# Patient Record
Sex: Female | Born: 1995 | Race: White | Hispanic: No | Marital: Married | Smoking: Never smoker
Health system: Southern US, Community
[De-identification: ages and names within clinical notes are randomized; demographics above are authoritative.]

## PROBLEM LIST (undated history)

## (undated) DIAGNOSIS — F419 Anxiety disorder, unspecified: Secondary | ICD-10-CM

## (undated) DIAGNOSIS — F32A Depression, unspecified: Secondary | ICD-10-CM

## (undated) DIAGNOSIS — G43909 Migraine, unspecified, not intractable, without status migrainosus: Secondary | ICD-10-CM

## (undated) HISTORY — DX: Anxiety disorder, unspecified: F41.9

## (undated) HISTORY — DX: Depression, unspecified: F32.A

## (undated) HISTORY — DX: Migraine, unspecified, not intractable, without status migrainosus: G43.909

---

## 2021-03-07 ENCOUNTER — Inpatient Hospital Stay (HOSPITAL_COMMUNITY): Admission: AD | Admit: 2021-03-07 | Payer: Self-pay | Source: Home / Self Care | Admitting: Obstetrics and Gynecology

## 2021-03-21 LAB — OB RESULTS CONSOLE RPR
RPR: NONREACTIVE
RPR: NONREACTIVE

## 2021-04-30 LAB — OB RESULTS CONSOLE GBS: GBS: NEGATIVE

## 2021-05-23 ENCOUNTER — Encounter (HOSPITAL_COMMUNITY): Payer: Self-pay | Admitting: *Deleted

## 2021-05-23 ENCOUNTER — Telehealth (HOSPITAL_COMMUNITY): Payer: Self-pay | Admitting: *Deleted

## 2021-05-23 NOTE — Telephone Encounter (Signed)
Preadmission screen  

## 2021-05-27 ENCOUNTER — Telehealth (HOSPITAL_COMMUNITY): Payer: Self-pay | Admitting: *Deleted

## 2021-05-27 NOTE — Telephone Encounter (Signed)
Preadmission screen  

## 2021-05-28 ENCOUNTER — Telehealth (HOSPITAL_COMMUNITY): Payer: Self-pay | Admitting: *Deleted

## 2021-05-28 ENCOUNTER — Other Ambulatory Visit: Payer: Self-pay | Admitting: Obstetrics and Gynecology

## 2021-05-28 NOTE — Telephone Encounter (Signed)
Preadmission screen  

## 2021-05-29 ENCOUNTER — Other Ambulatory Visit: Payer: Self-pay | Admitting: Obstetrics and Gynecology

## 2021-05-29 LAB — SARS CORONAVIRUS 2 (TAT 6-24 HRS): SARS Coronavirus 2: NEGATIVE

## 2021-05-30 ENCOUNTER — Inpatient Hospital Stay (HOSPITAL_COMMUNITY): Payer: BC Managed Care – PPO

## 2021-05-31 ENCOUNTER — Inpatient Hospital Stay (HOSPITAL_COMMUNITY): Payer: BC Managed Care – PPO

## 2021-05-31 ENCOUNTER — Inpatient Hospital Stay (HOSPITAL_COMMUNITY): Payer: BC Managed Care – PPO | Admitting: Anesthesiology

## 2021-05-31 ENCOUNTER — Other Ambulatory Visit: Payer: Self-pay

## 2021-05-31 ENCOUNTER — Inpatient Hospital Stay (HOSPITAL_COMMUNITY)
Admission: AD | Admit: 2021-05-31 | Discharge: 2021-06-03 | DRG: 787 | Disposition: A | Discharge status: Home / Self Care | Payer: BC Managed Care – PPO | Attending: Obstetrics and Gynecology | Admitting: Obstetrics and Gynecology

## 2021-05-31 ENCOUNTER — Encounter (HOSPITAL_COMMUNITY): Payer: Self-pay | Admitting: Obstetrics and Gynecology

## 2021-05-31 DIAGNOSIS — R609 Edema, unspecified: Secondary | ICD-10-CM | POA: Diagnosis not present

## 2021-05-31 DIAGNOSIS — D62 Acute posthemorrhagic anemia: Secondary | ICD-10-CM | POA: Diagnosis not present

## 2021-05-31 DIAGNOSIS — O9081 Anemia of the puerperium: Secondary | ICD-10-CM | POA: Diagnosis not present

## 2021-05-31 DIAGNOSIS — O1204 Gestational edema, complicating childbirth: Secondary | ICD-10-CM | POA: Diagnosis present

## 2021-05-31 DIAGNOSIS — O99340 Other mental disorders complicating pregnancy, unspecified trimester: Secondary | ICD-10-CM | POA: Diagnosis present

## 2021-05-31 DIAGNOSIS — O99344 Other mental disorders complicating childbirth: Secondary | ICD-10-CM | POA: Diagnosis present

## 2021-05-31 DIAGNOSIS — F419 Anxiety disorder, unspecified: Secondary | ICD-10-CM | POA: Diagnosis present

## 2021-05-31 DIAGNOSIS — Z23 Encounter for immunization: Secondary | ICD-10-CM

## 2021-05-31 DIAGNOSIS — O9902 Anemia complicating childbirth: Secondary | ICD-10-CM | POA: Diagnosis present

## 2021-05-31 DIAGNOSIS — Z3A39 39 weeks gestation of pregnancy: Secondary | ICD-10-CM | POA: Diagnosis not present

## 2021-05-31 DIAGNOSIS — Z3490 Encounter for supervision of normal pregnancy, unspecified, unspecified trimester: Secondary | ICD-10-CM | POA: Diagnosis present

## 2021-05-31 DIAGNOSIS — O99214 Obesity complicating childbirth: Secondary | ICD-10-CM | POA: Diagnosis present

## 2021-05-31 DIAGNOSIS — O324XX0 Maternal care for high head at term, not applicable or unspecified: Secondary | ICD-10-CM | POA: Diagnosis present

## 2021-05-31 DIAGNOSIS — F319 Bipolar disorder, unspecified: Secondary | ICD-10-CM | POA: Diagnosis present

## 2021-05-31 LAB — CBC
HCT: 30.8 % — ABNORMAL LOW (ref 36.0–46.0)
Hemoglobin: 9.9 g/dL — ABNORMAL LOW (ref 12.0–15.0)
MCH: 25.6 pg — ABNORMAL LOW (ref 26.0–34.0)
MCHC: 32.1 g/dL (ref 30.0–36.0)
MCV: 79.6 fL — ABNORMAL LOW (ref 80.0–100.0)
Platelets: 215 10*3/uL (ref 150–400)
RBC: 3.87 MIL/uL (ref 3.87–5.11)
RDW: 12.6 % (ref 11.5–15.5)
WBC: 6.1 10*3/uL (ref 4.0–10.5)
nRBC: 0 % (ref 0.0–0.2)

## 2021-05-31 LAB — RPR: RPR Ser Ql: NONREACTIVE

## 2021-05-31 LAB — TYPE AND SCREEN
ABO/RH(D): O POS
Antibody Screen: NEGATIVE

## 2021-05-31 MED ORDER — FENTANYL-BUPIVACAINE-NACL 0.5-0.125-0.9 MG/250ML-% EP SOLN
12.0000 mL/h | EPIDURAL | Status: DC | PRN
  Administered 2021-05-31: 12 mL/h via EPIDURAL
  Filled 2021-05-31: qty 250

## 2021-05-31 MED ORDER — PHENYLEPHRINE 40 MCG/ML (10ML) SYRINGE FOR IV PUSH (FOR BLOOD PRESSURE SUPPORT): 80.0000 ug | PREFILLED_SYRINGE | INTRAVENOUS | Status: DC | PRN

## 2021-05-31 MED ORDER — TERBUTALINE SULFATE 1 MG/ML IJ SOLN: 0.2500 mg | Freq: Once | INTRAMUSCULAR | Status: DC | PRN

## 2021-05-31 MED ORDER — DIPHENHYDRAMINE HCL 50 MG/ML IJ SOLN
12.5000 mg | INTRAMUSCULAR | Status: DC | PRN
Start: 2021-05-31 — End: 2021-06-01
  Administered 2021-05-31: 12.5 mg via INTRAVENOUS
  Filled 2021-05-31: qty 1

## 2021-05-31 MED ORDER — SOD CITRATE-CITRIC ACID 500-334 MG/5ML PO SOLN
30.0000 mL | ORAL | Status: DC | PRN
  Administered 2021-06-01: 30 mL via ORAL
  Filled 2021-05-31: qty 30

## 2021-05-31 MED ORDER — EPHEDRINE 5 MG/ML INJ: 10.0000 mg | INTRAVENOUS | Status: DC | PRN

## 2021-05-31 MED ORDER — LIDOCAINE HCL (PF) 1 % IJ SOLN
INTRAMUSCULAR | Status: DC | PRN
  Administered 2021-05-31 (×2): 4 mL via EPIDURAL

## 2021-05-31 MED ORDER — MISOPROSTOL 25 MCG QUARTER TABLET
25.0000 ug | ORAL_TABLET | ORAL | Status: DC | PRN
  Administered 2021-05-31 (×2): 25 ug via VAGINAL
  Filled 2021-05-31 (×3): qty 1

## 2021-05-31 MED ORDER — OXYTOCIN-SODIUM CHLORIDE 30-0.9 UT/500ML-% IV SOLN
1.0000 m[IU]/min | INTRAVENOUS | Status: DC
  Administered 2021-05-31: 4 m[IU]/min via INTRAVENOUS
  Administered 2021-05-31: 2 m[IU]/min via INTRAVENOUS
  Administered 2021-06-01: 18 m[IU]/min via INTRAVENOUS

## 2021-05-31 MED ORDER — ACETAMINOPHEN 325 MG PO TABS: 650.0000 mg | ORAL_TABLET | ORAL | Status: DC | PRN

## 2021-05-31 MED ORDER — LIDOCAINE HCL (PF) 1 % IJ SOLN: 30.0000 mL | INTRAMUSCULAR | Status: DC | PRN

## 2021-05-31 MED ORDER — OXYTOCIN BOLUS FROM INFUSION
333.0000 mL | Freq: Once | INTRAVENOUS | Status: DC
Start: 2021-05-31 — End: 2021-06-01

## 2021-05-31 MED ORDER — LACTATED RINGERS IV SOLN: INTRAVENOUS | Status: DC

## 2021-05-31 MED ORDER — LACTATED RINGERS IV SOLN
500.0000 mL | Freq: Once | INTRAVENOUS | Status: AC
  Administered 2021-05-31: 500 mL via INTRAVENOUS

## 2021-05-31 MED ORDER — OXYTOCIN-SODIUM CHLORIDE 30-0.9 UT/500ML-% IV SOLN
2.5000 [IU]/h | INTRAVENOUS | Status: DC
  Filled 2021-05-31: qty 500

## 2021-05-31 MED ORDER — ONDANSETRON HCL 4 MG/2ML IJ SOLN
4.0000 mg | Freq: Four times a day (QID) | INTRAMUSCULAR | Status: DC | PRN
  Administered 2021-05-31: 4 mg via INTRAVENOUS
  Filled 2021-05-31: qty 2

## 2021-05-31 MED ORDER — LACTATED RINGERS IV SOLN: 500.0000 mL | INTRAVENOUS | Status: DC | PRN

## 2021-05-31 NOTE — Progress Notes (Signed)
Lindsey Lee is a 25 y.o. G1P0 at [redacted]w[redacted]d by LMP admitted for induction of labor due to worsening anxiety on meds.  Subjective: Feels contractions getting more intense  Objective: BP (!) 101/54 (BP Location: Left Arm)   Pulse 90   Temp 98.8 F (37.1 C) (Oral)   Resp 17   Ht 5\' 2"  (1.575 m)   Wt 83.7 kg   BMI 33.76 kg/m  No intake/output data recorded. No intake/output data recorded.  FHT:  FHR: 143 bpm, variability: moderate,  accelerations:  Present,  decelerations:  Absent UC:   irregular, every 3 minutes SVE:   2-3/7-/-1 AROM- clear  Labs: Lab Results  Component Value Date   WBC 6.1 05/31/2021   HGB 9.9 (L) 05/31/2021   HCT 30.8 (L) 05/31/2021   MCV 79.6 (L) 05/31/2021   PLT 215 05/31/2021    Assessment / Plan: IOL due to medical issues  Labor: Progressing normally Preeclampsia:  no signs or symptoms of toxicity Fetal Wellbeing:  Category I Pain Control:  Labor support without medications I/D:  n/a Anticipated MOD:  NSVD  Kenichi Cassada J 05/31/2021, 8:20 AM

## 2021-05-31 NOTE — H&P (Addendum)
Lindsey Lee is a 25 y.o. 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 OB History     Gravida  1   Para      Term      Preterm      AB      Living         SAB      IAB      Ectopic      Multiple      Live Births             History reviewed. No pertinent past medical history. History reviewed. No pertinent surgical history. Family History: family history is not on file. Social History:  reports that she has never smoked. She has never used smokeless tobacco. She reports that she does not drink alcohol and does not use drugs.     Maternal Diabetes: No Genetic Screening: Normal Maternal Ultrasounds/Referrals: Normal Fetal Ultrasounds or other Referrals:  Fetal echo Maternal Substance Abuse:  No Significant Maternal Medications:  Meds include: Zoloft Significant Maternal Lab Results:  Group B Strep negative Other Comments:  None  Review of Systems  Constitutional: Negative.   All other systems reviewed and are negative. Maternal Medical History:  Contractions: Perceived severity is mild.   Fetal activity: Perceived fetal activity is normal.   Last perceived fetal movement was within the past hour.   Prenatal complications: no prenatal complications Prenatal Complications - Diabetes: none.  Dilation: 1 Effacement (%): 50 Station: -3 Exam by:: Boneta Lucks Adcox, RN Temperature 98.3 F (36.8 C), temperature source Oral, resp. rate 18, height 5\' 2"  (1.575 m), weight 83.7 kg. Maternal Exam:  Uterine Assessment: Contraction strength is mild.  Contraction frequency is rare.  Abdomen: Patient reports no abdominal tenderness. Fetal presentation: vertex Introitus: Normal vulva. Normal vagina.  Ferning test: not done.  Nitrazine test: not done. Amniotic fluid character: not assessed. Pelvis: questionable for delivery.    Physical Exam Constitutional:      Appearance: Normal appearance. She is normal  weight.  HENT:     Head: Normocephalic and atraumatic.  Cardiovascular:     Rate and Rhythm: Normal rate and regular rhythm.     Pulses: Normal pulses.     Heart sounds: Normal heart sounds.  Pulmonary:     Effort: Pulmonary effort is normal.     Breath sounds: Normal breath sounds.  Genitourinary:    General: Normal vulva.  Musculoskeletal:        General: Normal range of motion.     Cervical back: Normal range of motion and neck supple.  Skin:    General: Skin is warm and dry.  Neurological:     General: No focal deficit present.     Mental Status: She is alert and oriented to person, place, and time.  Psychiatric:        Mood and Affect: Mood normal.        Behavior: Behavior normal.    Prenatal labs: ABO, Rh: --/--/O POS (09/23 0048) Antibody: NEG (09/23 0048) Rubella:  imm RPR: Nonreactive, Nonreactive (07/14 0000)  HBsAg:   imm HIV:   neg GBS: Negative/-- (08/23 0000)   Assessment/Plan: 39+ week IUP IOL for increasing anxiety   Lindsey Lee J 05/31/2021, 6:23 AM

## 2021-05-31 NOTE — Anesthesia Procedure Notes (Signed)
Epidural Patient location during procedure: OB Start time: 05/31/2021 11:09 AM End time: 05/31/2021 11:17 AM  Staffing Anesthesiologist: Mal Amabile, MD Performed: anesthesiologist   Preanesthetic Checklist Completed: patient identified, IV checked, site marked, risks and benefits discussed, surgical consent, monitors and equipment checked, pre-op evaluation and timeout performed  Epidural Patient position: sitting Prep: DuraPrep and site prepped and draped Patient monitoring: continuous pulse ox and blood pressure Approach: midline Location: L4-L5 Injection technique: LOR air  Needle:  Needle type: Tuohy  Needle gauge: 17 G Needle length: 9 cm and 9 Needle insertion depth: 5 cm Catheter type: closed end flexible Catheter size: 19 Gauge Catheter at skin depth: 10 cm Test dose: negative and Other  Assessment Events: blood not aspirated, injection not painful, no injection resistance, no paresthesia and negative IV test  Additional Notes Patient identified. Risks and benefits discussed including failed block, incomplete  Pain control, post dural puncture headache, nerve damage, paralysis, blood pressure Changes, nausea, vomiting, reactions to medications-both toxic and allergic and post Partum back pain. All questions were answered. Patient expressed understanding and wished to proceed. Sterile technique was used throughout procedure. Epidural site was Dressed with sterile barrier dressing. No paresthesias, signs of intravascular injection Or signs of intrathecal spread were encountered.  Patient was more comfortable after the epidural was dosed. Please see RN's note for documentation of vital signs and FHR which are stable. Reason for block:procedure for pain

## 2021-05-31 NOTE — Progress Notes (Signed)
Lindsey Lee is a 25 y.o. G1P0 at [redacted]w[redacted]d by LMP admitted for induction of labor due to worsening anxiety on meds.  Subjective: Comfortable with epidural  Objective: BP 101/66   Pulse 85   Temp 99.5 F (37.5 C) (Axillary)   Resp 18   Ht 5\' 2"  (1.575 m)   Wt 83.7 kg   SpO2 99%   BMI 33.76 kg/m  No intake/output data recorded. No intake/output data recorded.  FHT:  FHR: 143 bpm, variability: moderate,  accelerations:  Present,  decelerations:  Absent UC:   irregular, every 3 minutes SVE:   6-7/100/0  Labs: Lab Results  Component Value Date   WBC 6.1 05/31/2021   HGB 9.9 (L) 05/31/2021   HCT 30.8 (L) 05/31/2021   MCV 79.6 (L) 05/31/2021   PLT 215 05/31/2021    Assessment / Plan: IOL due to medical issues  Labor: Progressing normally Preeclampsia:  no signs or symptoms of toxicity Fetal Wellbeing:  Category I Pain Control:  Labor support without medications I/D:  n/a Anticipated MOD:  NSVD  Hannan Tetzlaff J 05/31/2021, 6:44 PM

## 2021-05-31 NOTE — Anesthesia Preprocedure Evaluation (Addendum)
Anesthesia Evaluation  Patient identified by MRN, date of birth, ID band Patient awake    Reviewed: Allergy & Precautions, Patient's Chart, lab work & pertinent test results  Airway Mallampati: II  TM Distance: >3 FB Neck ROM: Full    Dental  (+) Teeth Intact, Dental Advisory Given   Pulmonary neg pulmonary ROS,    Pulmonary exam normal breath sounds clear to auscultation       Cardiovascular negative cardio ROS Normal cardiovascular exam Rhythm:Regular Rate:Normal     Neuro/Psych Bipolar Disorder negative neurological ROS     GI/Hepatic Neg liver ROS, GERD  ,  Endo/Other  Obesity  Renal/GU negative Renal ROS  negative genitourinary   Musculoskeletal Scoliosis- thoracolumbar   Abdominal (+) + obese,   Peds  Hematology  (+) anemia ,   Anesthesia Other Findings Thoracolumbar dextroscoliosis  Reproductive/Obstetrics (+) Pregnancy                            Anesthesia Physical Anesthesia Plan  ASA: 2  Anesthesia Plan: Epidural   Post-op Pain Management:    Induction:   PONV Risk Score and Plan:   Airway Management Planned: Natural Airway  Additional Equipment:   Intra-op Plan:   Post-operative Plan:   Informed Consent: I have reviewed the patients History and Physical, chart, labs and discussed the procedure including the risks, benefits and alternatives for the proposed anesthesia with the patient or authorized representative who has indicated his/her understanding and acceptance.       Plan Discussed with: Anesthesiologist  Anesthesia Plan Comments: (UPDATE 06/01/21 12:41 AM: Patient to go to OR for C section due to failure of descent. Labor epidural in place and functioning well. Will plan to use epidural for surgical anesthesia. Discussed with patient. )       Anesthesia Quick Evaluation

## 2021-05-31 NOTE — Progress Notes (Signed)
Late entry note:                                                                                                                                                                                                                                                                                                                                                                                                                                                                                                                                                                                     Lindsey Lee is a 25 y.o. G1P0 at [redacted]w[redacted]d by ultrasound admitted for induction of labor. Discussed r/b of IUPC placement and patient consents.   Subjective: S/p epidural, reports some pain on  the left side of her abdomen.   Objective: Vitals:   05/31/21 1501 05/31/21 1531 05/31/21 1601 05/31/21 1631  BP: 103/60 110/68 (!) 97/57 105/61  Pulse: 85 86 86 83  Resp:    18  Temp:      TempSrc:      Weight:      Height:        No intake/output data recorded. No intake/output data recorded. Pitocin infusing at 4 mu   FHT:  FHR: 120-15 bpm, variability: moderate,  accelerations:  Present,  decelerations:  Present occasional early and variable UC:   regular, every 2-4 minutes SVE:   Dilation: 3.5 Effacement (%): 80 Station: -1 Exam by:: m Lorra Freeman cnm IUPC placed without difficulty Bloody show present   Labs:   Recent Labs    05/31/21 0319  WBC 6.1  HGB 9.9*  HCT 30.8*  PLT 215    Assessment / Plan: G1P0 25 y.o. [redacted]w[redacted]d Induction of labor due to increasing anxiety, hx of bipolar on meds,  progressing well on pitocin  Labor: Continue to increase Pitocin to achieve >200 MVUs Recommend left lateral position with peanut  Fetal Wellbeing:  Category II with progression to cat 1 Pain Control:  Epidural, encouraged epidural PCA after position changes  Anticipated MOD:  NSVD  Dr. Billy Coast updated with patient  status   Karena Addison, CNM, MSN 05/31/2021, 5:09 PM

## 2021-06-01 ENCOUNTER — Encounter (HOSPITAL_COMMUNITY): Payer: Self-pay | Admitting: Obstetrics and Gynecology

## 2021-06-01 ENCOUNTER — Encounter (HOSPITAL_COMMUNITY): Payer: Self-pay

## 2021-06-01 ENCOUNTER — Encounter (HOSPITAL_COMMUNITY)
Admission: AD | Disposition: A | Discharge status: Home / Self Care | Payer: Self-pay | Source: Home / Self Care | Attending: Obstetrics and Gynecology

## 2021-06-01 DIAGNOSIS — F319 Bipolar disorder, unspecified: Secondary | ICD-10-CM | POA: Diagnosis present

## 2021-06-01 DIAGNOSIS — O99340 Other mental disorders complicating pregnancy, unspecified trimester: Secondary | ICD-10-CM | POA: Diagnosis present

## 2021-06-01 DIAGNOSIS — O9902 Anemia complicating childbirth: Secondary | ICD-10-CM | POA: Diagnosis present

## 2021-06-01 LAB — CBC
HCT: 24.8 % — ABNORMAL LOW (ref 36.0–46.0)
Hemoglobin: 8.1 g/dL — ABNORMAL LOW (ref 12.0–15.0)
MCH: 25.9 pg — ABNORMAL LOW (ref 26.0–34.0)
MCHC: 32.7 g/dL (ref 30.0–36.0)
MCV: 79.2 fL — ABNORMAL LOW (ref 80.0–100.0)
Platelets: 189 10*3/uL (ref 150–400)
RBC: 3.13 MIL/uL — ABNORMAL LOW (ref 3.87–5.11)
RDW: 12.7 % (ref 11.5–15.5)
WBC: 15.7 10*3/uL — ABNORMAL HIGH (ref 4.0–10.5)
nRBC: 0 % (ref 0.0–0.2)

## 2021-06-01 SURGERY — Surgical Case
Anesthesia: Epidural | Wound class: Clean Contaminated

## 2021-06-01 MED ORDER — BUPIVACAINE HCL (PF) 0.25 % IJ SOLN
INTRAMUSCULAR | Status: DC | PRN
  Administered 2021-06-01: 30 mL

## 2021-06-01 MED ORDER — NALBUPHINE HCL 10 MG/ML IJ SOLN
5.0000 mg | INTRAMUSCULAR | Status: DC | PRN
  Administered 2021-06-01 (×2): 5 mg via INTRAVENOUS
  Filled 2021-06-01 (×2): qty 1

## 2021-06-01 MED ORDER — TRANEXAMIC ACID-NACL 1000-0.7 MG/100ML-% IV SOLN
INTRAVENOUS | Status: AC
  Filled 2021-06-01: qty 100

## 2021-06-01 MED ORDER — SIMETHICONE 80 MG PO CHEW: 80.0000 mg | CHEWABLE_TABLET | ORAL | Status: DC | PRN

## 2021-06-01 MED ORDER — OXYTOCIN-SODIUM CHLORIDE 30-0.9 UT/500ML-% IV SOLN
2.5000 [IU]/h | INTRAVENOUS | Status: AC
  Administered 2021-06-01: 2.5 [IU]/h via INTRAVENOUS

## 2021-06-01 MED ORDER — PRENATAL MULTIVITAMIN CH
1.0000 | ORAL_TABLET | Freq: Every day | ORAL | Status: DC
  Administered 2021-06-01 – 2021-06-03 (×3): 1 via ORAL
  Filled 2021-06-01 (×3): qty 1

## 2021-06-01 MED ORDER — SERTRALINE HCL 50 MG PO TABS
50.0000 mg | ORAL_TABLET | Freq: Every day | ORAL | Status: DC
  Administered 2021-06-01: 50 mg via ORAL
  Filled 2021-06-01 (×2): qty 1

## 2021-06-01 MED ORDER — POLYSACCHARIDE IRON COMPLEX 150 MG PO CAPS
150.0000 mg | ORAL_CAPSULE | Freq: Every day | ORAL | Status: DC
  Administered 2021-06-02 – 2021-06-03 (×2): 150 mg via ORAL
  Filled 2021-06-01 (×2): qty 1

## 2021-06-01 MED ORDER — MENTHOL 3 MG MT LOZG: 1.0000 | LOZENGE | OROMUCOSAL | Status: DC | PRN

## 2021-06-01 MED ORDER — STERILE WATER FOR IRRIGATION IR SOLN
Status: DC | PRN
  Administered 2021-06-01: 1

## 2021-06-01 MED ORDER — LAMOTRIGINE 100 MG PO TABS
100.0000 mg | ORAL_TABLET | Freq: Every day | ORAL | Status: DC
  Administered 2021-06-01: 100 mg via ORAL
  Filled 2021-06-01 (×3): qty 1

## 2021-06-01 MED ORDER — MIRTAZAPINE 7.5 MG PO TABS
7.5000 mg | ORAL_TABLET | Freq: Every day | ORAL | Status: DC
  Administered 2021-06-01 – 2021-06-02 (×2): 7.5 mg via ORAL
  Filled 2021-06-01 (×3): qty 1

## 2021-06-01 MED ORDER — KETOROLAC TROMETHAMINE 30 MG/ML IJ SOLN
30.0000 mg | Freq: Four times a day (QID) | INTRAMUSCULAR | Status: AC
  Administered 2021-06-01 (×4): 30 mg via INTRAVENOUS
  Filled 2021-06-01 (×4): qty 1

## 2021-06-01 MED ORDER — SODIUM CHLORIDE 0.9 % IR SOLN
Status: DC | PRN
  Administered 2021-06-01: 1

## 2021-06-01 MED ORDER — COCONUT OIL OIL: 1.0000 "application " | TOPICAL_OIL | Status: DC | PRN

## 2021-06-01 MED ORDER — FENTANYL CITRATE (PF) 100 MCG/2ML IJ SOLN
INTRAMUSCULAR | Status: AC
  Filled 2021-06-01: qty 2

## 2021-06-01 MED ORDER — SODIUM CHLORIDE 0.9 % IV SOLN
500.0000 mg | INTRAVENOUS | Status: DC
  Administered 2021-06-01: 500 mg via INTRAVENOUS
  Filled 2021-06-01: qty 25

## 2021-06-01 MED ORDER — HYDROXYZINE HCL 25 MG PO TABS: 50.0000 mg | ORAL_TABLET | Freq: Three times a day (TID) | ORAL | Status: DC | PRN

## 2021-06-01 MED ORDER — MORPHINE SULFATE (PF) 0.5 MG/ML IJ SOLN
INTRAMUSCULAR | Status: DC | PRN
  Administered 2021-06-01: 3 mg via EPIDURAL

## 2021-06-01 MED ORDER — ZOLPIDEM TARTRATE 5 MG PO TABS: 5.0000 mg | ORAL_TABLET | Freq: Every evening | ORAL | Status: DC | PRN

## 2021-06-01 MED ORDER — SENNOSIDES-DOCUSATE SODIUM 8.6-50 MG PO TABS
2.0000 | ORAL_TABLET | Freq: Every day | ORAL | Status: DC
  Administered 2021-06-02 (×2): 2 via ORAL
  Filled 2021-06-01 (×2): qty 2

## 2021-06-01 MED ORDER — MORPHINE SULFATE (PF) 0.5 MG/ML IJ SOLN
INTRAMUSCULAR | Status: AC
  Filled 2021-06-01: qty 10

## 2021-06-01 MED ORDER — ONDANSETRON HCL 4 MG/2ML IJ SOLN
INTRAMUSCULAR | Status: DC | PRN
  Administered 2021-06-01: 4 mg via INTRAVENOUS

## 2021-06-01 MED ORDER — SIMETHICONE 80 MG PO CHEW
80.0000 mg | CHEWABLE_TABLET | Freq: Three times a day (TID) | ORAL | Status: DC
  Administered 2021-06-01 – 2021-06-03 (×7): 80 mg via ORAL
  Filled 2021-06-01 (×8): qty 1

## 2021-06-01 MED ORDER — WITCH HAZEL-GLYCERIN EX PADS: 1.0000 "application " | MEDICATED_PAD | CUTANEOUS | Status: DC | PRN

## 2021-06-01 MED ORDER — DIBUCAINE (PERIANAL) 1 % EX OINT: 1.0000 "application " | TOPICAL_OINTMENT | CUTANEOUS | Status: DC | PRN

## 2021-06-01 MED ORDER — OXYTOCIN-SODIUM CHLORIDE 30-0.9 UT/500ML-% IV SOLN
INTRAVENOUS | Status: AC
  Filled 2021-06-01: qty 500

## 2021-06-01 MED ORDER — LIDOCAINE-EPINEPHRINE (PF) 2 %-1:200000 IJ SOLN
INTRAMUSCULAR | Status: DC | PRN
  Administered 2021-06-01 (×3): 5 mL via EPIDURAL
  Administered 2021-06-01: 3 mL via EPIDURAL
  Administered 2021-06-01: 2 mL via EPIDURAL

## 2021-06-01 MED ORDER — TETANUS-DIPHTH-ACELL PERTUSSIS 5-2.5-18.5 LF-MCG/0.5 IM SUSY
0.5000 mL | PREFILLED_SYRINGE | Freq: Once | INTRAMUSCULAR | Status: DC
  Filled 2021-06-01: qty 0.5

## 2021-06-01 MED ORDER — OXYCODONE-ACETAMINOPHEN 5-325 MG PO TABS: 1.0000 | ORAL_TABLET | ORAL | Status: DC | PRN

## 2021-06-01 MED ORDER — FENTANYL CITRATE (PF) 100 MCG/2ML IJ SOLN
INTRAMUSCULAR | Status: DC | PRN
  Administered 2021-06-01: 100 ug via EPIDURAL

## 2021-06-01 MED ORDER — LACTATED RINGERS IV SOLN: INTRAVENOUS | Status: DC | PRN

## 2021-06-01 MED ORDER — METHYLERGONOVINE MALEATE 0.2 MG/ML IJ SOLN: 0.2000 mg | INTRAMUSCULAR | Status: DC | PRN

## 2021-06-01 MED ORDER — METHYLERGONOVINE MALEATE 0.2 MG PO TABS
0.2000 mg | ORAL_TABLET | ORAL | Status: DC | PRN
Start: 2021-06-01 — End: 2021-06-03

## 2021-06-01 MED ORDER — ONDANSETRON HCL 4 MG/2ML IJ SOLN
INTRAMUSCULAR | Status: AC
  Filled 2021-06-01: qty 2

## 2021-06-01 MED ORDER — MAGNESIUM OXIDE -MG SUPPLEMENT 400 (240 MG) MG PO TABS
400.0000 mg | ORAL_TABLET | Freq: Every day | ORAL | Status: DC
  Administered 2021-06-03: 400 mg via ORAL
  Filled 2021-06-01 (×2): qty 1

## 2021-06-01 MED ORDER — INFLUENZA VAC SPLIT QUAD 0.5 ML IM SUSY
0.5000 mL | PREFILLED_SYRINGE | INTRAMUSCULAR | Status: AC | PRN
  Administered 2021-06-03: 0.5 mL via INTRAMUSCULAR
  Filled 2021-06-01: qty 0.5

## 2021-06-01 MED ORDER — SCOPOLAMINE 1 MG/3DAYS TD PT72
1.0000 | MEDICATED_PATCH | TRANSDERMAL | Status: DC
  Administered 2021-06-01: 1.5 mg via TRANSDERMAL
  Filled 2021-06-01: qty 1

## 2021-06-01 MED ORDER — OXYTOCIN-SODIUM CHLORIDE 30-0.9 UT/500ML-% IV SOLN
INTRAVENOUS | Status: DC | PRN
  Administered 2021-06-01: 30 [IU] via INTRAVENOUS

## 2021-06-01 MED ORDER — DIPHENHYDRAMINE HCL 25 MG PO CAPS
25.0000 mg | ORAL_CAPSULE | Freq: Four times a day (QID) | ORAL | Status: DC | PRN
  Administered 2021-06-01: 25 mg via ORAL
  Filled 2021-06-01: qty 1

## 2021-06-01 MED ORDER — BUPIVACAINE HCL (PF) 0.5 % IJ SOLN
INTRAMUSCULAR | Status: AC
  Filled 2021-06-01: qty 30

## 2021-06-01 MED ORDER — ACETAMINOPHEN 500 MG PO TABS
1000.0000 mg | ORAL_TABLET | Freq: Four times a day (QID) | ORAL | Status: DC
  Administered 2021-06-01 – 2021-06-03 (×7): 1000 mg via ORAL
  Filled 2021-06-01 (×8): qty 2

## 2021-06-01 MED ORDER — CEFAZOLIN SODIUM-DEXTROSE 2-3 GM-%(50ML) IV SOLR
INTRAVENOUS | Status: DC | PRN
Start: 2021-06-01 — End: 2021-06-01
  Administered 2021-06-01: 2 g via INTRAVENOUS

## 2021-06-01 MED ORDER — IBUPROFEN 600 MG PO TABS
600.0000 mg | ORAL_TABLET | Freq: Four times a day (QID) | ORAL | Status: DC
  Administered 2021-06-02 – 2021-06-03 (×4): 600 mg via ORAL
  Filled 2021-06-01 (×7): qty 1

## 2021-06-01 SURGICAL SUPPLY — 34 items
BENZOIN TINCTURE PRP APPL 2/3 (GAUZE/BANDAGES/DRESSINGS) ×2 IMPLANT
CHLORAPREP W/TINT 26ML (MISCELLANEOUS) ×2 IMPLANT
CLAMP CORD UMBIL (MISCELLANEOUS) IMPLANT
CLOTH BEACON ORANGE TIMEOUT ST (SAFETY) ×2 IMPLANT
DRSG OPSITE POSTOP 4X10 (GAUZE/BANDAGES/DRESSINGS) ×2 IMPLANT
ELECT REM PT RETURN 9FT ADLT (ELECTROSURGICAL) ×2
ELECTRODE REM PT RTRN 9FT ADLT (ELECTROSURGICAL) ×1 IMPLANT
EXTRACTOR VACUUM KIWI (MISCELLANEOUS) IMPLANT
GLOVE BIO SURGEON STRL SZ7.5 (GLOVE) ×2 IMPLANT
GLOVE BIOGEL PI IND STRL 7.0 (GLOVE) ×1 IMPLANT
GLOVE BIOGEL PI INDICATOR 7.0 (GLOVE) ×1
GOWN STRL REUS W/TWL LRG LVL3 (GOWN DISPOSABLE) ×4 IMPLANT
KIT ABG SYR 3ML LUER SLIP (SYRINGE) IMPLANT
NEEDLE HYPO 22GX1.5 SAFETY (NEEDLE) ×2 IMPLANT
NEEDLE HYPO 25X5/8 SAFETYGLIDE (NEEDLE) IMPLANT
NEEDLE SPNL 20GX3.5 QUINCKE YW (NEEDLE) IMPLANT
NS IRRIG 1000ML POUR BTL (IV SOLUTION) ×2 IMPLANT
PACK C SECTION WH (CUSTOM PROCEDURE TRAY) ×2 IMPLANT
PENCIL SMOKE EVAC W/HOLSTER (ELECTROSURGICAL) ×2 IMPLANT
STRIP CLOSURE SKIN 1/2X4 (GAUZE/BANDAGES/DRESSINGS) ×2 IMPLANT
SUT MNCRL 0 VIOLET CTX 36 (SUTURE) ×2 IMPLANT
SUT MNCRL AB 3-0 PS2 27 (SUTURE) IMPLANT
SUT MON AB 2-0 CT1 27 (SUTURE) ×2 IMPLANT
SUT MON AB-0 CT1 36 (SUTURE) ×4 IMPLANT
SUT MONOCRYL 0 CTX 36 (SUTURE) ×2
SUT PLAIN 0 NONE (SUTURE) IMPLANT
SUT PLAIN 2 0 (SUTURE)
SUT PLAIN 2 0 XLH (SUTURE) IMPLANT
SUT PLAIN ABS 2-0 CT1 27XMFL (SUTURE) IMPLANT
SYR 20CC LL (SYRINGE) IMPLANT
SYR CONTROL 10ML LL (SYRINGE) ×2 IMPLANT
TOWEL OR 17X24 6PK STRL BLUE (TOWEL DISPOSABLE) ×2 IMPLANT
TRAY FOLEY W/BAG SLVR 14FR LF (SET/KITS/TRAYS/PACK) ×2 IMPLANT
WATER STERILE IRR 1000ML POUR (IV SOLUTION) ×2 IMPLANT

## 2021-06-01 NOTE — Transfer of Care (Signed)
Immediate Anesthesia Transfer of Care Note  Patient: Lindsey Lee  Procedure(s) Performed: CESAREAN SECTION  Patient Location: PACU  Anesthesia Type:Epidural  Level of Consciousness: awake  Airway & Oxygen Therapy: Patient Spontanous Breathing  Post-op Assessment: Report given to RN and Post -op Vital signs reviewed and stable  Post vital signs: Reviewed and stable  Last Vitals:  Vitals Value Taken Time  BP    Temp    Pulse    Resp    SpO2      Last Pain:  Vitals:   06/01/21 0049  TempSrc:   PainSc: 0-No pain         Complications: No notable events documented.

## 2021-06-01 NOTE — Plan of Care (Signed)
  Problem: Clinical Measurements: Goal: Ability to maintain clinical measurements within normal limits will improve Outcome: Progressing Pushing patient to Increase fluids to help UOP. Foley removed, IV maintenance fluids running per order by Lindsey Lee.   Problem: Coping: Goal: Level of anxiety will decrease Outcome: Progressing Has started back on medication regimen today for her anxiety and depression.  . Problem: Activity: Goal: Ability to tolerate increased activity will improve Outcome: Progressing  Has been in and out of bed, with minimal assistance about 3 times during shift.

## 2021-06-01 NOTE — Progress Notes (Signed)
INTERVAL NOTE: S/P P C/S, arrest of descent  Live born female  Birth Weight: 8 lb 0.6 oz (3646 g) APGAR: 9, 10  Newborn Delivery   Birth date/time: 06/[birth year]/2021 01:40:00 Delivery type: C-Section, Low Transverse Trial of labor: Yes C-section categorization: Primary     Baby name: [name] Delivering provider: Olivia Mackie    S:  Sitting in bed, teary, no pain, itching resolved with meds, small bleed, denies HA/NV/dizziness, Foley cath patent.  Increased anxiety and tired, feels very overwhelmed by everything, crying throughout the visit.   Patient admits to stopping all mood stabilizer meds 2 weeks ago, ran out of prescriptions and current psychiatrist informed her he would not continue managing her mental health needs if she is not living locally [employer]).  Did not inform OB provider of status.  Spouse present and encouraging patient to inform health care team of medication status change.  O:   Vitals:   06/01/21 0400 06/01/21 0519 06/01/21 0644 06/01/21 0827  BP: 118/89 105/67 103/76 119/72  Pulse: (!) 118 (!) 118 (!) 109 (!) 114  Resp: 18 18 18 17   Temp: 99.3 F (37.4 C) 99.6 F (37.6 C) 98.7 F (37.1 C) 98.6 F (37 C)  TempSrc: Oral Oral Oral Oral  SpO2: 100% 97% 96% 97%  Weight:      Height:         AAO x 3, NAD  FF bellow U  Dressing clean, intact  Scant lochia  A / P:    G1P1001 , post op day 0  Principal Problem:   Postpartum care following cesarean delivery 9/24  Stable post op/partum  Routine PP orders Active Problems:   Encounter for induction of labor   Cesarean delivery / arrest of descent   Maternal anemia, with delivery - IDA w/ ABL  - asymptomatic  - IV venofer today, start oral iron therapy tomorrow   Bipolar disease during pregnancy, unspecified trimester (HCC)  - noncompliance with medical regimen, abrupt stop of all meds 2 weeks ago, likely some withdrawal effect with increased anxiety, insomnia. No SI/HI.    - resume medications as  previously Rxed, minimal risk for side effects with only 2 weeks interval off meds.   - Zoloft 50 mg daily, Remeron 7.5 mg at HS, Lamictal 100 mg daily  - hydroxyzine 50 mg PRN for anxiety/sleep  - will refer to mental health provider locally, continue therapy/counseling with current therapist via telehealth  - social service consult inpatient  - close f/u in office in 2 weeks  POC in consult w/ Dr. 10/24, CNM, MSN  06/01/2021 10:31 AM

## 2021-06-01 NOTE — Lactation Note (Addendum)
This note was copied from a baby's chart. Lactation Consultation Note  Patient Name: Lindsey Lee Date: 06/01/2021 Reason for consult: Follow-up assessment;Mother's request;Difficult latch;Term Age:24 hours   LC worked on different positions but nipples are compressible and collapses with each attempt.  Mom to wear breast shells not pumping, sleeping or nursing.   Mom to pre pump 5-10 min before latching.   With next feeding if unable to get him to latch in prone position, RN, Lianne Cure  alerted to use 20 NS.  Mom personal pump that she is bringing from car.  Mom aware that if use NS it is a barrier to milk letdown need to use DEBP q 3 hrs for 15 min after latching.  RN Lianne Cure aware of plan stated above.  All questions answered at the end of the visit.  Maternal Data Has patient been taught Hand Expression?: Yes  Feeding Mother's Current Feeding Choice: Breast Milk and Donor Milk Nipple Type: Slow - flow  LATCH Score                    Lactation Tools Discussed/Used Tools: Pump;Flanges;Shells Flange Size: 21 Breast pump type: Manual Reason for Pumping: elongate nippple Pumping frequency: pre pump 5-10 min before latching  Interventions Interventions: Breast feeding basics reviewed;Support pillows;Skin to skin;Breast massage;Position options;Hand express;Expressed milk;Pre-pump if needed;Shells  Discharge Pump: Personal;Manual  Consult Status Consult Status: Follow-up Date: 06/02/21 Follow-up type: In-patient    Lindsey Saladin  Lee 06/01/2021, 5:14 PM

## 2021-06-01 NOTE — Progress Notes (Signed)
Lindsey Lee is a 25 y.o. G1P0 at [redacted]w[redacted]d by LMP admitted for induction of labor due to worsening anxiety on meds.  Subjective: Pushing x 2h  Objective: BP 115/74   Pulse 99   Temp 98.6 F (37 C) (Oral)   Resp 16   Ht 5\' 2"  (1.575 m)   Wt 83.7 kg   SpO2 99%   BMI 33.76 kg/m  No intake/output data recorded. No intake/output data recorded.  FHT:  FHR: 143 bpm, variability: moderate,  accelerations:  Present,  decelerations:  Absent UC:   irregular, every 2 minutes and adequate by IUPC SVE:   10/100/0  Labs: Lab Results  Component Value Date   WBC 6.1 05/31/2021   HGB 9.9 (L) 05/31/2021   HCT 30.8 (L) 05/31/2021   MCV 79.6 (L) 05/31/2021   PLT 215 05/31/2021    Assessment / Plan: IOL due to medical issues Failure to descend  Labor: Progressing normally Preeclampsia:  no signs or symptoms of toxicity Fetal Wellbeing:  Category I Pain Control:  epidural I/D:  n/a Anticipated MOD:  Proceed with csection. Surgical indications and procedure discussed. Risks of infection, bleeding and injury to surrounding organs with need for repair discussed. OR notified.  Lindsey Lee J 06/01/2021, 12:40 AM

## 2021-06-01 NOTE — Lactation Note (Addendum)
This note was copied from a baby's chart. Lactation Consultation Note  Patient Name: Lindsey Lee Date: 06/01/2021 Reason for consult: Initial assessment;1st time breastfeeding;Primapara;Term Age:24 hours Maternal history of depression Rx sertratline and lamictal  LC in to visit with P1 Mom of term infant born by C/S.  Mom holding swaddled baby sleeping.  Baby has had 5 ml of colostrum by spoon at an hour old.   Baby unswaddled and placed STS on Mom's chest with permission.  Mom educated on importance of baby being STS to entice baby to breastfeed.  Reassured Mom that baby would awaken after hours of sleepiness and be interested in feeding at the breast. Mom to call for assistance with cues.   Reviewed breast massage and hand expression, colostrum easily expressed.  Baby showing no cues currently.  FOB sleeping and Mom not sleepy currently.  Lactation brochure provided.  Mom aware of IP and OP lactation support available to her.   Mom to call her RN prn for assistance with latch.    Interventions Interventions: Breast feeding basics reviewed;Skin to skin;Breast massage;Hand express;Adjust position  Discharge Pump: Personal  Consult Status Consult Status: Follow-up Date: 06/02/21 Follow-up type: In-patient    Judee Clara 06/01/2021, 8:03 AM

## 2021-06-01 NOTE — Op Note (Signed)
Cesarean Section Procedure Note  Indications: failure to progress: arrest of descent  Pre-operative Diagnosis: 39 week 4 day pregnancy.  Post-operative Diagnosis: same  Surgeon: Lenoard Aden   Assistants: Idelle Jo, CNM  Anesthesia: Epidural anesthesia and Local anesthesia 0.25.% bupivacaine  ASA Class: 2  Procedure Details  The patient was seen in the Holding Room. The risks, benefits, complications, treatment options, and expected outcomes were discussed with the patient.  The patient concurred with the proposed plan, giving informed consent. The risks of anesthesia, infection, bleeding and possible injury to other organs discussed. Injury to bowel, bladder, or ureter with possible need for repair discussed. Possible need for transfusion with secondary risks of hepatitis or HIV acquisition discussed. Post operative complications to include but not limited to DVT, PE and Pneumonia noted. The site of surgery properly noted/marked. The patient was taken to Operating Room # A, identified as Lindsey Lee and the procedure verified as C-Section Delivery. A Time Out was held and the above information confirmed.  After induction of anesthesia, the patient was draped and prepped in the usual sterile manner. A Pfannenstiel incision was made and carried down through the subcutaneous tissue to the fascia. Fascial incision was made and extended transversely using Mayo scissors. The fascia was separated from the underlying rectus tissue superiorly and inferiorly. The peritoneum was identified and entered. Peritoneal incision was extended longitudinally. The utero-vesical peritoneal reflection was incised transversely and the bladder flap was bluntly freed from the lower uterine segment. A low transverse uterine incision(Kerr hysterotomy) was made. Delivered from OA presentation was a  female with Apgar scores of 8 at one minute and 9 at five minutes. Bulb suctioning gently performed. Neonatal team in  attendance.After the umbilical cord was clamped and cut cord blood was obtained for evaluation. The placenta was removed intact and appeared normal. The uterus was curetted with a dry lap pack. Good hemostasis was noted.The uterine outline, tubes and ovaries appeared normal. The uterine incision was closed with running locked sutures of 0 Monocryl x 2 layers. Hemostasis was observed. The parietal peritoneum was closed with a running 2-0 Monocryl suture. The fascia was then reapproximated with running sutures of 0 Monocryl. The skin was reapproximated with 4-0 vicryl after [place] closure with 2-0 plain.  Instrument, sponge, and needle counts were correct prior the abdominal closure and at the conclusion of the case.   Findings: FTLM, OA, post placenta. Nl tubes and ovaries  Estimated Blood Loss:   400         Drains: foley                 Specimens: placenta                 Complications:  None; patient tolerated the procedure well.         Disposition: PACU - hemodynamically stable.         Condition: stable  Attending Attestation: I performed the procedure.

## 2021-06-02 ENCOUNTER — Encounter (HOSPITAL_COMMUNITY): Payer: Self-pay | Admitting: Obstetrics and Gynecology

## 2021-06-02 DIAGNOSIS — R609 Edema, unspecified: Secondary | ICD-10-CM | POA: Diagnosis not present

## 2021-06-02 MED ORDER — HYDROCHLOROTHIAZIDE 12.5 MG PO CAPS
12.5000 mg | ORAL_CAPSULE | Freq: Every day | ORAL | Status: DC
  Administered 2021-06-02 – 2021-06-03 (×2): 12.5 mg via ORAL
  Filled 2021-06-02 (×2): qty 1

## 2021-06-02 MED ORDER — LAMOTRIGINE 100 MG PO TABS
100.0000 mg | ORAL_TABLET | Freq: Every day | ORAL | Status: DC
  Administered 2021-06-02: 100 mg via ORAL
  Filled 2021-06-02 (×2): qty 1

## 2021-06-02 MED ORDER — SERTRALINE HCL 50 MG PO TABS
50.0000 mg | ORAL_TABLET | Freq: Every day | ORAL | Status: DC
  Administered 2021-06-02: 50 mg via ORAL
  Filled 2021-06-02: qty 1

## 2021-06-02 NOTE — Progress Notes (Signed)
CSW met with MOB to complete consult for history of anxiety, depression, and Edinburgh of 17. CSW observed MOB sitting in recliner, infant in bassinet, and FOB on couch. MOB gave CSW verbal consent to complete consult while FOB was present. CSW explained role, and reason for consult. MOB was pleasant, and polite during engagement with CSW. MOB reported, history of anxiety, and depression in 2017. MOB reported, she is currently taking  Mirtazapine, Lamotrigine, and Zoloft. MOB reported, history of therapy while in college at [employer] (2017). MOB reported, her current therapist is Nina, who she sees online through [facility].   CSW provided education regarding the baby blues period vs. perinatal mood disorders, discussed treatment and gave resources for mental health follow up if concerns arise. CSW recommends self- evaluation during the postpartum time period using the New Mom Checklist from Postpartum Progress and encouraged MOB to contact a medical professional if symptoms are noted at any time.   MOB reported, since delivery she feels, "tired, but better than yesterday". MOB reported, yesterday was hard for her, because infant was not latching. MOB reported, FOB, and her best friend Erica are very supportive. MOB denied SI, and HI when CSW assessed for safety.   MOB reported, she does not receive WIC, nor food stamps. CSW encourage MOB to complete application(s) for additional support. MOB reported, there are no barriers to follow up infant's care. MOB reported, she has all essentials needed to care for infant. MOB reported, infant has a car seat, and crib. MOB denied any additional barriers.     CSW provided education on sudden infant death syndrome (SIDS).  CSW provided perinatal mood disorders resources.   CSW identifies no further need for intervention or barriers to discharge at this time.  Destaney Sarkis, MSW, LCSW-A Clinical Social Worker- Weekends [phone]  

## 2021-06-02 NOTE — Lactation Note (Signed)
This note was copied from a baby's chart. Lactation Consultation Note  Patient Name: Lindsey Lee [id number] Date: 06/02/2021 Age:24 hours  Attempted LC visit to 84 hours old infant and P1 mother. Mother requests LC to come back to room at another time.     Tyreck Bell A Higuera Ancidey 06/02/2021, 4:09 PM

## 2021-06-02 NOTE — Progress Notes (Signed)
Subjective: POD# 1 Live born female  Birth Weight: 8 lb 0.6 oz (3646 g) APGAR: 9, 10  Newborn Delivery   Birth date/time: 06/01/2021 01:40:00 Delivery type: C-Section, Low Transverse Trial of labor: Yes C-section categorization: Primary     Baby name: [name] Delivering provider: Olivia Mackie   circumcision planned Feeding: breast  Pain control at delivery: Epidural   Reports feeling much better today, mood improved  Patient reports tolerating PO.   Breast symptoms:working with LC for latch, pumping Pain controlled with  PO meds Denies HA/SOB/C/P/N/V/dizziness. Flatus absent. She reports vaginal bleeding as normal, without clots.  She is ambulating, urinating without difficulty.     Objective:   VS:    Vitals:   06/01/21 1303 06/01/21 1623 06/01/21 2100 06/02/21 0613  BP: 107/76 110/69 105/67 (!) 90/55  Pulse: 97 85 83 74  Resp: 17  19 16   Temp:   97.7 F (36.5 C) 98.3 F (36.8 C)  TempSrc:   Oral Oral  SpO2: 99% 97% 98% 96%  Weight:      Height:         Intake/Output Summary (Last 24 hours) at 06/02/2021 06/04/2021 Last data filed at 06/01/2021 1545 Gross per 24 hour  Intake 240 ml  Output 175 ml  Net 65 ml        Recent Labs    05/31/21 0319 06/01/21 0500  WBC 6.1 15.7*  HGB 9.9* 8.1*  HCT 30.8* 24.8*  PLT 215 189     Blood type: --/--/O POS (09/23 0048)  Rubella:   immune Vaccines: TDaP          ITD         Flu             planned at discharge                    COVID-19 UTD   Physical Exam:  General: alert, cooperative, and no distress CV: Regular rate and rhythm Resp: clear Abdomen: soft, nontender, normal bowel sounds Incision: clean, dry, and intact Uterine Fundus: firm, below umbilicus, nontender Lochia: minimal Ext: edema + 2 pedal and generalized  Assessment/Plan: 25 y.o.   POD# 1. G1P1001                  Principal Problem:   Postpartum care following cesarean delivery 9/24 Active Problems:   Encounter for induction of  labor   Cesarean delivery / arrest of descent   Dependent edema  - start HCTZ short course 12.5 mg daily x 5     Maternal anemia, with delivery - IDA w/ ABL             - asymptomatic             - S/P IV venofer  - start oral iron therapy today   Bipolar disease during pregnancy, unspecified trimester (HCC)             - noncompliance with medical regimen, abrupt stop of all meds 2 weeks ago, likely some withdrawal effect with increased anxiety, insomnia. No SI/HI.               - resumed medications as previously Rxed, minimal risk for side effects with only 2 weeks interval off meds.              - Zoloft 50 mg daily, Remeron 7.5 mg at HS, Lamictal 100 mg daily             -  hydroxyzine 50 mg PRN for anxiety/sleep             - will refer to mental health provider locally, continue therapy/counseling with current therapist via telehealth             - social service consult inpatient             - close f/u in office in 2 weeks  Doing well, stable.               Advance diet as tolerated Encourage rest when baby rests Breastfeeding support Encourage to ambulate, warm fluids for gut motility Routine post-op care  Neta Mends, CNM, MSN 06/02/2021, 9:37 AM

## 2021-06-02 NOTE — Anesthesia Postprocedure Evaluation (Signed)
Anesthesia Post Note  Patient: Zahara Rembert  Procedure(s) Performed: CESAREAN SECTION     Patient location during evaluation: PACU Anesthesia Type: Epidural Level of consciousness: oriented and awake and alert Pain management: pain level controlled Vital Signs Assessment: post-procedure vital signs reviewed and stable Respiratory status: spontaneous breathing, respiratory function stable and nonlabored ventilation Cardiovascular status: blood pressure returned to baseline and stable Postop Assessment: no headache, no backache, no apparent nausea or vomiting and epidural receding Anesthetic complications: no   No notable events documented.  Last Vitals:  Vitals:   06/01/21 2100 06/02/21 0613  BP: 105/67 (!) 90/55  Pulse: 83 74  Resp: 19 16  Temp: 36.5 C 36.8 C  SpO2: 98% 96%    Last Pain:  Vitals:   06/02/21 0613  TempSrc: Oral  PainSc:    Pain Goal:                   Lucretia Kern

## 2021-06-03 MED ORDER — POLYSACCHARIDE IRON COMPLEX 150 MG PO CAPS
150.0000 mg | ORAL_CAPSULE | Freq: Every day | ORAL | 1 refills | Status: DC
Start: 2021-06-03 — End: 2021-09-10

## 2021-06-03 MED ORDER — ACETAMINOPHEN 500 MG PO TABS: 1000.0000 mg | ORAL_TABLET | Freq: Four times a day (QID) | ORAL | 0 refills | Status: DC

## 2021-06-03 MED ORDER — IBUPROFEN 600 MG PO TABS: 600.0000 mg | ORAL_TABLET | Freq: Four times a day (QID) | ORAL | 0 refills | Status: DC

## 2021-06-03 MED ORDER — MAGNESIUM OXIDE -MG SUPPLEMENT 400 (240 MG) MG PO TABS: 400.0000 mg | ORAL_TABLET | Freq: Every day | ORAL | 1 refills | Status: DC

## 2021-06-03 MED ORDER — OXYCODONE-ACETAMINOPHEN 5-325 MG PO TABS: 1.0000 | ORAL_TABLET | ORAL | 0 refills | Status: DC | PRN

## 2021-06-03 NOTE — Lactation Note (Signed)
This note was copied from a baby's chart. Lactation Consultation Note  Patient Name: Lindsey Lee CNOBS'J Date: 06/03/2021   Age:24 hours  Parents were seen prior to d/c. Mom has been pumping with her personal pump, using size 28 flanges, but she needs size 21 flanges. I provided a hand pump with a size 21 flange until parents are able to procure the proper flange size for their pump (parents are also aware of Maymom inserts).  Plan: Pump when infant gets formula (unless Mom needs to sleep).  Referral sent to outpatient lactation clinic to call Mom. Mom is interested in trying a nipple shield.   I provided parents with a hand-out on mirtazapine (Remeron) and lamotrigine (Lamictal) from [facility]. Parents are aware that if infant develops a rash (excluding newborn rash), Mom needs to stop breastfeeding until pediatrician has seen rash to r/o lamotrigine toxicity.   Parents know how to reach [place] for post-d/c questions. Lurline Hare [facility] 06/03/2021, 11:16 AM

## 2021-06-03 NOTE — Discharge Summary (Signed)
OB Discharge Summary  Patient Name: Lindsey Lee DOB: 1995/12/03 MRN: [id number]  Date of admission: 05/31/2021 Delivering provider: Olivia Mackie   Admitting diagnosis: Encounter for induction of labor [Z34.90] Intrauterine pregnancy: [redacted]w[redacted]d     Secondary diagnosis: Patient Active Problem List   Diagnosis Date Noted   Dependent edema 06/02/2021   Cesarean delivery / arrest of descent 06/01/2021   Postpartum care following cesarean delivery 9/24 06/01/2021   Maternal anemia, with delivery - IDA w/ ABL 06/01/2021   Bipolar disease during pregnancy, unspecified trimester (HCC) 06/01/2021   Encounter for induction of labor 05/31/2021    Date of discharge: 06/03/2021   Discharge diagnosis: Principal Problem:   Postpartum care following cesarean delivery 9/24 Active Problems:   Encounter for induction of labor   Cesarean delivery / arrest of descent   Maternal anemia, with delivery - IDA w/ ABL   Bipolar disease during pregnancy, unspecified trimester (HCC)   Dependent edema                                                           Post partum procedures: IV Venofer  Augmentation: AROM, Pitocin, and Cytotec Pain control: Epidural  Laceration:None  Episiotomy:None  Complications: None  Hospital course:  Induction of Labor With Cesarean Section   25 y.o. yo G1P1001 at [redacted]w[redacted]d was admitted to the hospital 05/31/2021 for induction of labor. The patient went for cesarean section due to Arrest of Dilation. Delivery details are as follows: Membrane Rupture Time/Date: 8:18 AM ,05/31/2021   Delivery Method:C-Section, Low Transverse  Details of operation can be found in separate operative Note.  Patient had an uncomplicated postpartum course. She is ambulating, tolerating a regular diet, passing flatus, and urinating well.  Patient is discharged home in stable condition on 06/03/21.      Newborn Data: Birth date:06/01/2021  Birth time:1:40 AM  Gender:Female  Living status:Living   Apgars:9 ,10  [id number] g                                Physical exam  Vitals:   06/02/21 0613 06/02/21 1833 06/02/21 2333 06/03/21 0531  BP: (!) 90/55 103/69 109/64 110/81  Pulse: 74 69 66 76  Resp: 16 16 14 18   Temp: 98.3 F (36.8 C) 98.9 F (37.2 C) 99.2 F (37.3 C) 98 F (36.7 C)  TempSrc: Oral Oral Oral Oral  SpO2: 96%  100% 100%  Weight:      Height:       General: alert, cooperative, and no distress Lochia: appropriate Uterine Fundus: firm Incision: Healing well with no significant drainage, Dressing is clean, dry, and intact DVT Evaluation: No evidence of DVT seen on physical exam. No significant calf/ankle edema.  Labs: Lab Results  Component Value Date   WBC 15.7 (H) 06/01/2021   HGB 8.1 (L) 06/01/2021   HCT 24.8 (L) 06/01/2021   MCV 79.2 (L) 06/01/2021   PLT 189 06/01/2021   No flowsheet data found. Edinburgh Postnatal Depression Scale Screening Tool 06/01/2021  I have been able to laugh and see the funny side of things. 0  I have looked forward with enjoyment to things. 1  I have blamed myself unnecessarily when things went wrong. 2  I have been anxious or  worried for no good reason. 3  I have felt scared or panicky for no good reason. 3  Things have been getting on top of me. 2  I have been so unhappy that I have had difficulty sleeping. 1  I have felt sad or miserable. 3  I have been so unhappy that I have been crying. 2  The thought of harming myself has occurred to me. 0  Edinburgh Postnatal Depression Scale Total 17   Vaccines: TDaP UTD         Flu    UTD         COVID-19   UTD  Discharge instructions:  per After Visit Summary  After Visit Meds:  Allergies as of 06/03/2021   No Known Allergies      Medication List     TAKE these medications    acetaminophen 500 MG tablet Commonly known as: TYLENOL Take 2 tablets (1,000 mg total) by mouth every 6 (six) hours.   ibuprofen 600 MG tablet Commonly known as: ADVIL Take 1 tablet  (600 mg total) by mouth every 6 (six) hours.   iron polysaccharides 150 MG capsule Commonly known as: Ferrex 150 Take 1 capsule (150 mg total) by mouth daily.   lamoTRIgine 100 MG tablet Commonly known as: LAMICTAL Take 100 mg by mouth daily.   magnesium oxide 400 (240 Mg) MG tablet Commonly known as: MAG-OX Take 1 tablet (400 mg total) by mouth daily. Start taking on: June 04, 2021   mirtazapine 7.5 MG tablet Commonly known as: REMERON Take 7.5 mg by mouth at bedtime.   multivitamin-prenatal 27-0.8 MG Tabs tablet Take 1 tablet by mouth daily at 12 noon.   sertraline 50 MG tablet Commonly known as: ZOLOFT Take 50 mg by mouth daily.       Diet: routine diet  Activity: Advance as tolerated. Pelvic rest for 6 weeks.   Newborn Data: Live born female  Birth Weight: 8 lb 0.6 oz (3646 g) APGAR: 9, 10  Newborn Delivery   Birth date/time: 06/01/2021 01:40:00 Delivery type: C-Section, Low Transverse Trial of labor: Yes C-section categorization: Primary     Named [name] Baby Feeding: Bottle and Breast Disposition:home with mother  Delivery Report:  Review the Delivery Report for details.    Follow up:  Follow-up Information     Olivia Mackie, MD. Schedule an appointment as soon as possible for a visit in 2 week(s).   Specialty: Obstetrics and Gynecology Contact information: [address] [place] [address] [phone]                Clancy Gourd, MSN 06/03/2021, 12:13 PM

## 2021-06-11 ENCOUNTER — Telehealth (HOSPITAL_COMMUNITY): Payer: Self-pay | Admitting: *Deleted

## 2021-06-11 NOTE — Telephone Encounter (Signed)
Attempted hospital discharge follow-up call. Left message for patient to return RN call. Deforest Hoyles, RN, 06/11/21, 1945.

## 2021-09-08 NOTE — L&D Delivery Note (Signed)
Operative Delivery Note-TOLAC At 8:44 PM a viable and healthy female was delivered via Vaginal, Vacuum Neurosurgeon).  Presentation: vertex; Position: Occiput,, Anterior; Station: +4.  Verbal consent: obtained from patient.  Deep persistent variable decels in setting of IUGR. Risks and benefits discussed in detail.  Risks include, but are not limited to the risks of anesthesia, bleeding, infection, damage to maternal tissues, fetal cephalhematoma.  There is also the risk of inability to effect vaginal delivery of the head, or shoulder dystocia that cannot be resolved by established maneuvers, leading to the need for emergency cesarean section.  APGAR: 8, 9; weight pending .   Placenta status: spontaneous ,intact .   Cord:  with the following complications: none.  Cord pH: na  Anesthesia:  epidural Instruments: kiwi x one pull Episiotomy: None Lacerations: 1st degree;Perineal Suture Repair: 3.0 vicryl rapide Est. Blood Loss (mL): 105  Mom to postpartum.  Baby to Couplet care / Skin to Skin.  Armoni Depass J 07/06/2022, 8:58 PM

## 2021-09-10 ENCOUNTER — Ambulatory Visit (INDEPENDENT_AMBULATORY_CARE_PROVIDER_SITE_OTHER): Payer: BC Managed Care – PPO | Admitting: Family Medicine

## 2021-09-10 ENCOUNTER — Other Ambulatory Visit: Payer: Self-pay

## 2021-09-10 ENCOUNTER — Encounter: Payer: Self-pay | Admitting: Family Medicine

## 2021-09-10 VITALS — BP 106/70 | HR 84 | Ht 62.0 in | Wt 117.0 lb

## 2021-09-10 DIAGNOSIS — F411 Generalized anxiety disorder: Secondary | ICD-10-CM | POA: Diagnosis not present

## 2021-09-10 MED ORDER — SERTRALINE HCL 50 MG PO TABS: 50.0000 mg | ORAL_TABLET | Freq: Every day | ORAL | 3 refills | Status: DC

## 2021-09-10 MED ORDER — ETONOGESTREL-ETHINYL ESTRADIOL 0.12-0.015 MG/24HR VA RING: VAGINAL_RING | VAGINAL | 12 refills | Status: DC

## 2021-09-10 NOTE — Progress Notes (Signed)
Subjective:    Patient ID: Lindsey Lee, female    DOB: 1995/09/25, 26 y.o.   MRN: [id number]  HPI Patient is a very pleasant 26 year old Caucasian female here today to reestablish care.  I saw the patient as a teenager.  Her parents are my patients.  Her mother has anxiety.  Her father has a history of hyperlipidemia.  Her paternal grandfather had Parkinson's disease.  The patient suffered with migraines the major.  She moved away to college at [employer] but she studied to become a Runner, broadcasting/film/video.  While in [employer], she saw psychiatrist.  Apparently she was diagnosed with possible bipolar disorder.  Ultimately they decided on cocktail of Remeron, Zoloft, and lamotrigine.  She felt like the Remeron gave her the most benefit.  She was taking these medications primarily to help with anxiety but she has a documented history of generalized anxiety disorder.  We had a long discussion today about bipolar.  She denies any history of mania.  She denies any history of prolonged insomnia, racing thoughts or impulsive decisions.  She denies any periods of flight of ideas or tangential speech.  She denies any delusions or hallucinations or suicidal thoughts.  She saw no benefit from the lamotrigine.  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  She became pregnant and subsequently she discontinued these 3 medications.  She has subsequently had her child.  She is not breast-feeding.  She is not on any birth control.  However her anxiety has returned.  She does not want to take mirtazapine because it feels like it makes her sleep too soundly at night and she would not be able to recall.  She is not breast-feeding and she has no desire to get pregnant so there are no lactation contraindications that we need to be concerned with.  She does feel that the Zoloft was beneficial. No past medical history on file. Past Surgical History:  Procedure Laterality Date   CESAREAN SECTION N/A  06/01/2021   Procedure: CESAREAN SECTION;  Surgeon: Olivia Mackie, MD;  Location: [facility];  Service: Obstetrics;  Laterality: N/A;      Review of Systems  All other systems reviewed and are negative.     Objective:   Physical Exam Vitals reviewed.  Constitutional:      General: She is not in acute distress.    Appearance: Normal appearance. She is normal weight. She is not ill-appearing, toxic-appearing or diaphoretic.  Cardiovascular:     Rate and Rhythm: Normal rate and regular rhythm.     Pulses: Normal pulses.     Heart sounds: Normal heart sounds.  Pulmonary:     Effort: Pulmonary effort is normal.     Breath sounds: Normal breath sounds.  Neurological:     General: No focal deficit present.     Mental Status: She is alert and oriented to person, place, and time. Mental status is at baseline.     Cranial Nerves: No cranial nerve deficit.     Motor: No weakness.     Gait: Gait normal.  Psychiatric:        Mood and Affect: Mood normal.        Behavior: Behavior normal.        Thought Content: Thought content normal.        Judgment: Judgment normal.          Assessment & Plan:  GAD (generalized anxiety disorder) I had a long discussion with the  patient about signs and symptoms of disorder.  In my previous encounters with her is a clean atrial nevus on behavior that would lead me to believe that she has not fallen.  This was diagnosed at an outside facility.  I do believe that she deals with generalized anxiety disorder.  Therefore we decided not to start back on a mood stabilizer yet.  We will start on Zoloft 50 mg daily and reassess in 4 weeks.  However I did counsel the patient about possible teratogenic effects of these medications.  Therefore I recommended that she resume birth control.  She would like to start back on the NuvaRing because she did well on this before.  Reassess in 4 to 6 weeks.

## 2021-11-13 LAB — OB RESULTS CONSOLE ABO/RH: RH Type: POSITIVE

## 2021-11-13 LAB — OB RESULTS CONSOLE ANTIBODY SCREEN: Antibody Screen: NEGATIVE

## 2021-12-06 ENCOUNTER — Encounter (HOSPITAL_COMMUNITY): Payer: Self-pay | Admitting: *Deleted

## 2021-12-06 ENCOUNTER — Inpatient Hospital Stay (HOSPITAL_COMMUNITY)
Admission: AD | Admit: 2021-12-06 | Discharge: 2021-12-06 | Disposition: A | Discharge status: Home / Self Care | Payer: No Typology Code available for payment source | Attending: Obstetrics and Gynecology | Admitting: Obstetrics and Gynecology

## 2021-12-06 ENCOUNTER — Inpatient Hospital Stay (HOSPITAL_COMMUNITY): Payer: No Typology Code available for payment source

## 2021-12-06 DIAGNOSIS — Z3A01 Less than 8 weeks gestation of pregnancy: Secondary | ICD-10-CM | POA: Diagnosis not present

## 2021-12-06 DIAGNOSIS — R1031 Right lower quadrant pain: Secondary | ICD-10-CM | POA: Insufficient documentation

## 2021-12-06 DIAGNOSIS — X58XXXA Exposure to other specified factors, initial encounter: Secondary | ICD-10-CM | POA: Diagnosis not present

## 2021-12-06 DIAGNOSIS — O9A211 Injury, poisoning and certain other consequences of external causes complicating pregnancy, first trimester: Secondary | ICD-10-CM | POA: Diagnosis not present

## 2021-12-06 DIAGNOSIS — Z6740 Type O blood, Rh positive: Secondary | ICD-10-CM

## 2021-12-06 DIAGNOSIS — S3991XA Unspecified injury of abdomen, initial encounter: Secondary | ICD-10-CM | POA: Insufficient documentation

## 2021-12-06 DIAGNOSIS — Z3490 Encounter for supervision of normal pregnancy, unspecified, unspecified trimester: Secondary | ICD-10-CM

## 2021-12-06 DIAGNOSIS — Y9289 Other specified places as the place of occurrence of the external cause: Secondary | ICD-10-CM | POA: Insufficient documentation

## 2021-12-06 DIAGNOSIS — R102 Pelvic and perineal pain: Secondary | ICD-10-CM | POA: Insufficient documentation

## 2021-12-06 DIAGNOSIS — O26891 Other specified pregnancy related conditions, first trimester: Secondary | ICD-10-CM | POA: Diagnosis not present

## 2021-12-06 DIAGNOSIS — R109 Unspecified abdominal pain: Secondary | ICD-10-CM | POA: Diagnosis not present

## 2021-12-06 DIAGNOSIS — Y990 Civilian activity done for income or pay: Secondary | ICD-10-CM | POA: Insufficient documentation

## 2021-12-06 LAB — URINALYSIS, ROUTINE W REFLEX MICROSCOPIC
Bilirubin Urine: NEGATIVE
Glucose, UA: NEGATIVE mg/dL
Hgb urine dipstick: NEGATIVE
Ketones, ur: 5 mg/dL — AB
Nitrite: NEGATIVE
Protein, ur: NEGATIVE mg/dL
Specific Gravity, Urine: 1.023 (ref 1.005–1.030)
pH: 7 (ref 5.0–8.0)

## 2021-12-06 LAB — CBC
HCT: 37.4 % (ref 36.0–46.0)
Hemoglobin: 13.2 g/dL (ref 12.0–15.0)
MCH: 30 pg (ref 26.0–34.0)
MCHC: 35.3 g/dL (ref 30.0–36.0)
MCV: 85 fL (ref 80.0–100.0)
Platelets: 222 10*3/uL (ref 150–400)
RBC: 4.4 MIL/uL (ref 3.87–5.11)
RDW: 12.5 % (ref 11.5–15.5)
WBC: 6.3 10*3/uL (ref 4.0–10.5)
nRBC: 0 % (ref 0.0–0.2)

## 2021-12-06 LAB — HCG, QUANTITATIVE, PREGNANCY: hCG, Beta Chain, Quant, S: 166135 m[IU]/mL — ABNORMAL HIGH (ref <5)

## 2021-12-06 LAB — POCT PREGNANCY, URINE: Preg Test, Ur: POSITIVE — AB
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
## 2021-12-14 LAB — OB RESULTS CONSOLE HIV ANTIBODY (ROUTINE TESTING): HIV: NONREACTIVE

## 2021-12-14 LAB — OB RESULTS CONSOLE HEPATITIS B SURFACE ANTIGEN: Hepatitis B Surface Ag: NEGATIVE

## 2021-12-14 LAB — OB RESULTS CONSOLE RPR: RPR: NONREACTIVE

## 2021-12-23 LAB — OB RESULTS CONSOLE RUBELLA ANTIBODY, IGM: Rubella: IMMUNE

## 2022-01-17 LAB — OB RESULTS CONSOLE GC/CHLAMYDIA
Chlamydia: NEGATIVE
Neisseria Gonorrhea: NEGATIVE

## 2022-01-21 ENCOUNTER — Ambulatory Visit: Payer: BC Managed Care – PPO | Admitting: Family Medicine

## 2022-01-21 ENCOUNTER — Other Ambulatory Visit: Payer: Self-pay

## 2022-01-21 VITALS — BP 96/72 | HR 83 | Temp 98.1°F | Ht 62.0 in | Wt 112.8 lb

## 2022-01-21 DIAGNOSIS — H1032 Unspecified acute conjunctivitis, left eye: Secondary | ICD-10-CM

## 2022-01-21 MED ORDER — POLYMYXIN B-TRIMETHOPRIM 10000-0.1 UNIT/ML-% OP SOLN: 2.0000 [drp] | OPHTHALMIC | 1 refills | Status: DC

## 2022-01-21 MED ORDER — SULFAMETHOXAZOLE-TRIMETHOPRIM 800-160 MG PO TABS: 1.0000 | ORAL_TABLET | Freq: Two times a day (BID) | ORAL | 0 refills | Status: DC

## 2022-01-21 MED ORDER — CEPHALEXIN 500 MG PO CAPS: 500.0000 mg | ORAL_CAPSULE | Freq: Three times a day (TID) | ORAL | 0 refills | Status: DC

## 2022-01-21 NOTE — Progress Notes (Signed)
? ?  Subjective:  ? ? Patient ID: Lindsey Lee, female    DOB: 1996-05-30, 26 y.o.   MRN: [id number] ? ?HPI ?Patient is a very pleasant 26 year old Caucasian female who has a red swollen left eye.  Upper eyelid is puffy and red and swollen.  The conjunctiva is blood red and irritated.  There is purulent exudate coming from the corner of her left eye.  She denies any blurry vision.  She has been battling a cold for the last several days ?Past Medical History:  ?Diagnosis Date  ? Anxiety   ? Depression   ? possible bipolar disorder (diagnosed in [place])  ? Migraines   ? ?Past Surgical History:  ?Procedure Laterality Date  ? CESAREAN SECTION N/A 06/01/2021  ? Procedure: CESAREAN SECTION;  Surgeon: Brien Few, MD;  Location: [facility];  Service: Obstetrics;  Laterality: N/A;  ? ? ? ? ?Review of Systems  ?All other systems reviewed and are negative. ? ?   ?Objective:  ? Physical Exam ?Vitals reviewed.  ?Constitutional:   ?   General: She is not in acute distress. ?   Appearance: Normal appearance. She is normal weight. She is not ill-appearing, toxic-appearing or diaphoretic.  ?Eyes:  ?   General: Vision grossly intact.     ?   Left eye: Discharge present. ?   Conjunctiva/sclera:  ?   Left eye: Left conjunctiva is injected. Exudate present.  ?Cardiovascular:  ?   Rate and Rhythm: Normal rate and regular rhythm.  ?   Pulses: Normal pulses.  ?   Heart sounds: Normal heart sounds.  ?Pulmonary:  ?   Effort: Pulmonary effort is normal.  ?   Breath sounds: Normal breath sounds.  ?Neurological:  ?   General: No focal deficit present.  ?   Mental Status: She is alert and oriented to person, place, and time. Mental status is at baseline.  ?   Cranial Nerves: No cranial nerve deficit.  ?   Motor: No weakness.  ?   Gait: Gait normal.  ?Psychiatric:     ?   Mood and Affect: Mood normal.     ?   Behavior: Behavior normal.     ?   Thought Content: Thought content normal.     ?   Judgment: Judgment normal.  ? ? ? ? ? ?    ?Assessment & Plan:  ?Acute bacterial conjunctivitis of left eye ?Begin Polytrim drops 2 drops in the left eye every 4 hours for the next 5 days.  If she develops preseptal cellulitis which would be severe erythema in the periocular tissues and swelling, I want her to start keflex 500 mg  tablets TID daily for the next 7 days. ? ?

## 2022-04-14 NOTE — Telephone Encounter (Signed)
Pls advice  

## 2022-06-18 LAB — OB RESULTS CONSOLE GBS: GBS: NEGATIVE

## 2022-06-29 ENCOUNTER — Encounter (HOSPITAL_COMMUNITY): Payer: Self-pay | Admitting: Obstetrics

## 2022-06-29 ENCOUNTER — Inpatient Hospital Stay (HOSPITAL_COMMUNITY)
Admission: AD | Admit: 2022-06-29 | Discharge: 2022-06-30 | Disposition: A | Discharge status: Home / Self Care | Payer: BC Managed Care – PPO | Attending: Obstetrics | Admitting: Obstetrics

## 2022-06-29 DIAGNOSIS — O471 False labor at or after 37 completed weeks of gestation: Secondary | ICD-10-CM | POA: Insufficient documentation

## 2022-06-29 DIAGNOSIS — Z3A37 37 weeks gestation of pregnancy: Secondary | ICD-10-CM | POA: Insufficient documentation

## 2022-06-29 DIAGNOSIS — O479 False labor, unspecified: Secondary | ICD-10-CM

## 2022-06-29 NOTE — MAU Note (Signed)
..  Lindsey Lee is a 27 y.o. at [redacted]w[redacted]d here in MAU reporting: contractions since 9:10pm, reports that they have intensified since they began. Denies vaginal bleeding or leaking of fluid. +FM

## 2022-06-30 DIAGNOSIS — O471 False labor at or after 37 completed weeks of gestation: Secondary | ICD-10-CM | POA: Diagnosis present

## 2022-06-30 DIAGNOSIS — Z3A37 37 weeks gestation of pregnancy: Secondary | ICD-10-CM

## 2022-06-30 DIAGNOSIS — O479 False labor, unspecified: Secondary | ICD-10-CM | POA: Diagnosis not present

## 2022-06-30 MED ORDER — OXYCODONE-ACETAMINOPHEN 5-325 MG PO TABS
2.0000 | ORAL_TABLET | Freq: Once | ORAL | Status: AC
  Administered 2022-06-30: 2 via ORAL
  Filled 2022-06-30: qty 2

## 2022-06-30 NOTE — MAU Note (Signed)
Pain management and labor precautions discussed with patient and husband. Pt understands is not currently in labor and states contractions are decreasing in intensity and frequency. Requesting pain medication prior to discharge

## 2022-06-30 NOTE — MAU Provider Note (Signed)
None      S: Ms. Lindsey Lee is a 26 y.o. G2P1001 at [redacted]w[redacted]d  who presents to MAU today complaining contractions q 5 minutes since 9 pm. She denies vaginal bleeding. She denies LOF. She reports normal fetal movement.    O: BP 116/83   Pulse 85   Resp 17   Ht 5\' 2"  (1.575 m)   Wt 56.2 kg   LMP 10/13/2021 Comment: blood work and confirmation at dr's office  SpO2 98%   BMI 22.64 kg/m  GENERAL: Well-developed, well-nourished female in no acute distress.  HEAD: Normocephalic, atraumatic.  CHEST: Normal effort of breathing, regular heart rate ABDOMEN: Soft, nontender, gravid  Cervical exam:  Dilation: 2 Effacement (%): 50 Cervical Position: Anterior Station: -1 Presentation: Vertex Exam by:: Apolinar Junes RN   Fetal Monitoring: Baseline: 135 Variability: moderate Accelerations: 15x15 Decelerations: no Contractions: Q 2-7 minutes   A: [facility]UP at [redacted]w[redacted]d  False labor -Unchanged after 2 hours of monitoring. Patient would like pain medication prior to discharge. Declines further monitoring or recheck. Given percocet in MAU.  P: Discharge home Labor precautions & fetal kick counts Keep appointment with Dr. Ronita Hipps tomorrow  Jorje Guild, NP 06/30/2022 3:16 AM

## 2022-07-03 ENCOUNTER — Encounter (HOSPITAL_COMMUNITY): Payer: Self-pay | Admitting: *Deleted

## 2022-07-03 ENCOUNTER — Telehealth (HOSPITAL_COMMUNITY): Payer: Self-pay | Admitting: *Deleted

## 2022-07-03 NOTE — Telephone Encounter (Signed)
Preadmission screen  

## 2022-07-04 ENCOUNTER — Other Ambulatory Visit: Payer: Self-pay | Admitting: Obstetrics and Gynecology

## 2022-07-06 ENCOUNTER — Inpatient Hospital Stay (HOSPITAL_COMMUNITY): Payer: BC Managed Care – PPO | Admitting: Anesthesiology

## 2022-07-06 ENCOUNTER — Encounter (HOSPITAL_COMMUNITY): Payer: Self-pay | Admitting: Obstetrics and Gynecology

## 2022-07-06 ENCOUNTER — Inpatient Hospital Stay (HOSPITAL_COMMUNITY)
Admission: RE | Admit: 2022-07-06 | Discharge: 2022-07-08 | DRG: 807 | Disposition: A | Discharge status: Home / Self Care | Payer: BC Managed Care – PPO | Attending: Obstetrics and Gynecology | Admitting: Obstetrics and Gynecology

## 2022-07-06 ENCOUNTER — Inpatient Hospital Stay (HOSPITAL_COMMUNITY): Payer: BC Managed Care – PPO

## 2022-07-06 ENCOUNTER — Other Ambulatory Visit: Payer: Self-pay

## 2022-07-06 DIAGNOSIS — F319 Bipolar disorder, unspecified: Secondary | ICD-10-CM | POA: Diagnosis present

## 2022-07-06 DIAGNOSIS — F419 Anxiety disorder, unspecified: Secondary | ICD-10-CM | POA: Diagnosis present

## 2022-07-06 DIAGNOSIS — O34211 Maternal care for low transverse scar from previous cesarean delivery: Secondary | ICD-10-CM | POA: Diagnosis present

## 2022-07-06 DIAGNOSIS — F32A Depression, unspecified: Secondary | ICD-10-CM | POA: Diagnosis present

## 2022-07-06 DIAGNOSIS — Z818 Family history of other mental and behavioral disorders: Secondary | ICD-10-CM | POA: Diagnosis not present

## 2022-07-06 DIAGNOSIS — O99344 Other mental disorders complicating childbirth: Secondary | ICD-10-CM | POA: Diagnosis present

## 2022-07-06 DIAGNOSIS — Z3A38 38 weeks gestation of pregnancy: Secondary | ICD-10-CM

## 2022-07-06 DIAGNOSIS — Z3490 Encounter for supervision of normal pregnancy, unspecified, unspecified trimester: Principal | ICD-10-CM

## 2022-07-06 DIAGNOSIS — Z23 Encounter for immunization: Secondary | ICD-10-CM

## 2022-07-06 DIAGNOSIS — O365930 Maternal care for other known or suspected poor fetal growth, third trimester, not applicable or unspecified: Secondary | ICD-10-CM | POA: Diagnosis present

## 2022-07-06 DIAGNOSIS — O34219 Maternal care for unspecified type scar from previous cesarean delivery: Secondary | ICD-10-CM | POA: Diagnosis not present

## 2022-07-06 LAB — TYPE AND SCREEN
ABO/RH(D): O POS
Antibody Screen: NEGATIVE

## 2022-07-06 LAB — CBC
HCT: 31.2 % — ABNORMAL LOW (ref 36.0–46.0)
Hemoglobin: 10.7 g/dL — ABNORMAL LOW (ref 12.0–15.0)
MCH: 28.4 pg (ref 26.0–34.0)
MCHC: 34.3 g/dL (ref 30.0–36.0)
MCV: 82.8 fL (ref 80.0–100.0)
Platelets: 190 10*3/uL (ref 150–400)
RBC: 3.77 MIL/uL — ABNORMAL LOW (ref 3.87–5.11)
RDW: 11.9 % (ref 11.5–15.5)
WBC: 5.2 10*3/uL (ref 4.0–10.5)
nRBC: 0 % (ref 0.0–0.2)

## 2022-07-06 MED ORDER — PHENYLEPHRINE 80 MCG/ML (10ML) SYRINGE FOR IV PUSH (FOR BLOOD PRESSURE SUPPORT): 80.0000 ug | PREFILLED_SYRINGE | INTRAVENOUS | Status: DC | PRN

## 2022-07-06 MED ORDER — EPHEDRINE 5 MG/ML INJ: 10.0000 mg | INTRAVENOUS | Status: DC | PRN

## 2022-07-06 MED ORDER — SIMETHICONE 80 MG PO CHEW: 80.0000 mg | CHEWABLE_TABLET | ORAL | Status: DC | PRN

## 2022-07-06 MED ORDER — ZOLPIDEM TARTRATE 5 MG PO TABS: 5.0000 mg | ORAL_TABLET | Freq: Every evening | ORAL | Status: DC | PRN

## 2022-07-06 MED ORDER — BENZOCAINE-MENTHOL 20-0.5 % EX AERO
1.0000 | INHALATION_SPRAY | CUTANEOUS | Status: DC | PRN
  Administered 2022-07-07: 1 via TOPICAL
  Filled 2022-07-06: qty 56

## 2022-07-06 MED ORDER — DIBUCAINE (PERIANAL) 1 % EX OINT: 1.0000 | TOPICAL_OINTMENT | CUTANEOUS | Status: DC | PRN

## 2022-07-06 MED ORDER — LACTATED RINGERS IV SOLN
500.0000 mL | Freq: Once | INTRAVENOUS | Status: AC
  Administered 2022-07-06: 500 mL via INTRAVENOUS

## 2022-07-06 MED ORDER — COCONUT OIL OIL
1.0000 | TOPICAL_OIL | Status: DC | PRN
  Administered 2022-07-08: 1 via TOPICAL

## 2022-07-06 MED ORDER — ACETAMINOPHEN 325 MG PO TABS
650.0000 mg | ORAL_TABLET | ORAL | Status: DC | PRN
  Administered 2022-07-07 – 2022-07-08 (×4): 650 mg via ORAL
  Filled 2022-07-06 (×4): qty 2

## 2022-07-06 MED ORDER — SERTRALINE HCL 50 MG PO TABS
50.0000 mg | ORAL_TABLET | Freq: Every day | ORAL | Status: DC
  Filled 2022-07-06: qty 1

## 2022-07-06 MED ORDER — PRENATAL MULTIVITAMIN CH
1.0000 | ORAL_TABLET | Freq: Every day | ORAL | Status: DC
  Administered 2022-07-07 – 2022-07-08 (×2): 1 via ORAL
  Filled 2022-07-06: qty 1

## 2022-07-06 MED ORDER — OXYTOCIN BOLUS FROM INFUSION
333.0000 mL | Freq: Once | INTRAVENOUS | Status: AC
  Administered 2022-07-06: 333 mL via INTRAVENOUS

## 2022-07-06 MED ORDER — OXYCODONE-ACETAMINOPHEN 5-325 MG PO TABS: 2.0000 | ORAL_TABLET | ORAL | Status: DC | PRN

## 2022-07-06 MED ORDER — ONDANSETRON HCL 4 MG/2ML IJ SOLN: 4.0000 mg | Freq: Four times a day (QID) | INTRAMUSCULAR | Status: DC | PRN

## 2022-07-06 MED ORDER — LACTATED RINGERS IV SOLN: 500.0000 mL | INTRAVENOUS | Status: DC | PRN

## 2022-07-06 MED ORDER — LACTATED RINGERS IV SOLN: INTRAVENOUS | Status: DC

## 2022-07-06 MED ORDER — IBUPROFEN 600 MG PO TABS
600.0000 mg | ORAL_TABLET | Freq: Four times a day (QID) | ORAL | Status: DC
  Administered 2022-07-07 – 2022-07-08 (×7): 600 mg via ORAL
  Filled 2022-07-06 (×6): qty 1

## 2022-07-06 MED ORDER — WITCH HAZEL-GLYCERIN EX PADS: 1.0000 | MEDICATED_PAD | CUTANEOUS | Status: DC | PRN

## 2022-07-06 MED ORDER — OXYCODONE-ACETAMINOPHEN 5-325 MG PO TABS: 1.0000 | ORAL_TABLET | ORAL | Status: DC | PRN

## 2022-07-06 MED ORDER — LIDOCAINE HCL (PF) 1 % IJ SOLN
INTRAMUSCULAR | Status: DC | PRN
  Administered 2022-07-06: 2 mL via EPIDURAL
  Administered 2022-07-06: 10 mL via EPIDURAL

## 2022-07-06 MED ORDER — METHYLERGONOVINE MALEATE 0.2 MG/ML IJ SOLN: 0.2000 mg | INTRAMUSCULAR | Status: DC | PRN

## 2022-07-06 MED ORDER — LIDOCAINE HCL (PF) 1 % IJ SOLN: 30.0000 mL | INTRAMUSCULAR | Status: DC | PRN

## 2022-07-06 MED ORDER — DIPHENHYDRAMINE HCL 50 MG/ML IJ SOLN: 12.5000 mg | INTRAMUSCULAR | Status: DC | PRN

## 2022-07-06 MED ORDER — SENNOSIDES-DOCUSATE SODIUM 8.6-50 MG PO TABS
2.0000 | ORAL_TABLET | Freq: Every day | ORAL | Status: DC
  Administered 2022-07-07: 2 via ORAL
  Filled 2022-07-06: qty 2

## 2022-07-06 MED ORDER — SOD CITRATE-CITRIC ACID 500-334 MG/5ML PO SOLN: 30.0000 mL | ORAL | Status: DC | PRN

## 2022-07-06 MED ORDER — OXYTOCIN-SODIUM CHLORIDE 30-0.9 UT/500ML-% IV SOLN
2.5000 [IU]/h | INTRAVENOUS | Status: DC
  Administered 2022-07-06: 2.5 [IU]/h via INTRAVENOUS

## 2022-07-06 MED ORDER — TETANUS-DIPHTH-ACELL PERTUSSIS 5-2.5-18.5 LF-MCG/0.5 IM SUSY: 0.5000 mL | PREFILLED_SYRINGE | Freq: Once | INTRAMUSCULAR | Status: DC

## 2022-07-06 MED ORDER — OXYTOCIN-SODIUM CHLORIDE 30-0.9 UT/500ML-% IV SOLN
1.0000 m[IU]/min | INTRAVENOUS | Status: DC
  Administered 2022-07-06: 2 m[IU]/min via INTRAVENOUS
  Filled 2022-07-06: qty 500

## 2022-07-06 MED ORDER — ACETAMINOPHEN 325 MG PO TABS: 650.0000 mg | ORAL_TABLET | ORAL | Status: DC | PRN

## 2022-07-06 MED ORDER — TERBUTALINE SULFATE 1 MG/ML IJ SOLN: 0.2500 mg | Freq: Once | INTRAMUSCULAR | Status: DC | PRN

## 2022-07-06 MED ORDER — FENTANYL-BUPIVACAINE-NACL 0.5-0.125-0.9 MG/250ML-% EP SOLN
12.0000 mL/h | EPIDURAL | Status: DC | PRN
  Filled 2022-07-06: qty 250

## 2022-07-06 MED ORDER — ONDANSETRON HCL 4 MG/2ML IJ SOLN: 4.0000 mg | INTRAMUSCULAR | Status: DC | PRN

## 2022-07-06 MED ORDER — METHYLERGONOVINE MALEATE 0.2 MG PO TABS: 0.2000 mg | ORAL_TABLET | ORAL | Status: DC | PRN

## 2022-07-06 MED ORDER — ONDANSETRON HCL 4 MG PO TABS: 4.0000 mg | ORAL_TABLET | ORAL | Status: DC | PRN

## 2022-07-06 MED ORDER — DIPHENHYDRAMINE HCL 25 MG PO CAPS: 25.0000 mg | ORAL_CAPSULE | Freq: Four times a day (QID) | ORAL | Status: DC | PRN

## 2022-07-06 MED ORDER — FENTANYL-BUPIVACAINE-NACL 0.5-0.125-0.9 MG/250ML-% EP SOLN
EPIDURAL | Status: DC | PRN
  Administered 2022-07-06: 12 mL/h via EPIDURAL

## 2022-07-06 NOTE — Anesthesia Procedure Notes (Signed)
Epidural Patient location during procedure: OB Start time: 07/06/2022 6:21 PM End time: 07/06/2022 6:32 PM  Staffing Anesthesiologist: Pervis Hocking, DO Performed: anesthesiologist   Preanesthetic Checklist Completed: patient identified, IV checked, risks and benefits discussed, monitors and equipment checked, pre-op evaluation and timeout performed  Epidural Patient position: sitting Prep: DuraPrep and site prepped and draped Patient monitoring: continuous pulse ox, blood pressure, heart rate and cardiac monitor Approach: midline Location: L3-L4 Injection technique: LOR air  Needle:  Needle type: Tuohy  Needle gauge: 17 G Needle length: 9 cm Needle insertion depth: 4 cm Catheter type: closed end flexible Catheter size: 19 Gauge Catheter at skin depth: 9 cm Test dose: negative  Assessment Sensory level: T8 Events: blood not aspirated, injection not painful, no injection resistance, no paresthesia and negative IV test  Additional Notes Patient identified. Risks/Benefits/Options discussed with patient including but not limited to bleeding, infection, nerve damage, paralysis, failed block, incomplete pain control, headache, blood pressure changes, nausea, vomiting, reactions to medication both or allergic, itching and postpartum back pain. Confirmed with bedside nurse the patient's most recent platelet count. Confirmed with patient that they are not currently taking any anticoagulation, have any bleeding history or any family history of bleeding disorders. Patient expressed understanding and wished to proceed. All questions were answered. Sterile technique was used throughout the entire procedure. Please see nursing notes for vital signs. Test dose was given through epidural catheter and negative prior to continuing to dose epidural or start infusion. Warning signs of high block given to the patient including shortness of breath, tingling/numbness in hands, complete motor  block, or any concerning symptoms with instructions to call for help. Patient was given instructions on fall risk and not to get out of bed. All questions and concerns addressed with instructions to call with any issues or inadequate analgesia.  Reason for block:procedure for pain

## 2022-07-06 NOTE — H&P (Signed)
Lindsey Lee is a 26 y.o. female presenting for IOL for IUGR. OB History     Gravida  2   Para  1   Term  1   Preterm      AB      Living  1      SAB      IAB      Ectopic      Multiple  0   Live Births  1          Past Medical History:  Diagnosis Date   Anxiety    Depression    possible bipolar disorder (diagnosed in [place])   Migraines    Past Surgical History:  Procedure Laterality Date   CESAREAN SECTION N/A 06/01/2021   Procedure: CESAREAN SECTION;  Surgeon: Olivia Mackie, MD;  Location: [facility];  Service: Obstetrics;  Laterality: N/A;   Family History: family history includes Anxiety disorder in her mother; Hyperlipidemia in her father; Parkinson's disease in her paternal grandfather. Social History:  reports that she has never smoked. She has never used smokeless tobacco. She reports that she does not drink alcohol and does not use drugs.     Maternal Diabetes: No Genetic Screening: Normal Maternal Ultrasounds/Referrals: Normal Fetal Ultrasounds or other Referrals:  None Maternal Substance Abuse:  No Significant Maternal Medications:  None Significant Maternal Lab Results:  Group B Strep negative Number of Prenatal Visits:greater than 3 verified prenatal visits Other Comments:  None  Review of Systems  Constitutional: Negative.   All other systems reviewed and are negative.  Maternal Medical History:  Reason for admission: Contractions.   Contractions: Onset was 3-5 hours ago.   Frequency: irregular.   Perceived severity is moderate.   Fetal activity: Perceived fetal activity is normal.   Last perceived fetal movement was within the past hour.   Prenatal complications: IUGR.   Prenatal Complications - Diabetes: none.   Dilation: 4 Effacement (%): 40 Station: Ballotable Exam by:: Joyel Chenette MD Blood pressure (!) 95/58, pulse 81, temperature 98.4 F (36.9 C), temperature source Oral, resp. rate 16, height 5\' 2"  (1.575 m),  weight 55 kg, last menstrual period 10/13/2021, not currently breastfeeding. Maternal Exam:  Uterine Assessment: Contraction strength is mild.  Contraction frequency is rare.  Abdomen: Patient reports no abdominal tenderness. Surgical scars: low transverse.   Fetal presentation: vertex Introitus: Normal vulva. Normal vagina.  Ferning test: positive.  Nitrazine test: positive. Amniotic fluid character: clear. Pelvis: questionable for delivery.   Cervix: Cervix evaluated by digital exam.     Physical Exam Constitutional:      Appearance: Normal appearance. She is normal weight.  HENT:     Head: Normocephalic and atraumatic.  Cardiovascular:     Rate and Rhythm: Normal rate and regular rhythm.  Pulmonary:     Effort: Pulmonary effort is normal.     Breath sounds: Normal breath sounds.  Abdominal:     General: Bowel sounds are normal.     Palpations: Abdomen is soft.  Genitourinary:    General: Normal vulva.  Musculoskeletal:     Cervical back: Normal range of motion and neck supple.  Skin:    General: Skin is dry.  Neurological:     General: No focal deficit present.     Mental Status: She is alert and oriented to person, place, and time.  Psychiatric:        Mood and Affect: Mood normal.        Behavior: Behavior normal.  Prenatal labs: ABO, Rh: --/--/O POS (10/29 1415) Antibody: NEG (10/29 1415) Rubella: Immune (04/17 0000) RPR: Nonreactive (04/08 0000)  HBsAg: Negative (04/08 0000)  HIV: Non-reactive (04/08 0000)  GBS: Negative/-- (10/11 0000)   Assessment/Plan: 38wks IUP Previous csection for TOLAC IUGR IOL   Lindsey Lee J 07/06/2022, 4:33 PM

## 2022-07-06 NOTE — Lactation Note (Signed)
This note was copied from a baby's chart. Lactation Consultation Note  Patient Name: Lindsey Lee [id number] Date: 07/06/2022 Reason for consult: L&D Initial assessment;Early term 37-38.6wks Age:25 hours Attempted to latch baby. Baby not interested in BF at this time. Wouldn't suckle at the breast. Alert looking around. Was suckling on her hand before placing at the breast then stopped. Mom asked if LC could check for tongue tie. Stated her now 9 yr old son had bad tongue tie and had to get it lasered d/t he couldn't transfer the milk from her breast. Mom stated she went to several Lactation appointments to help her BF. Mom wants to know up front so she doesn't go through all of the uncertainty of can the baby BF. Explained to mom the next couple of days will tell more that I can't tel right now. We can see if baby has good mobility of her tongue and how the BF is going. Will see mom once she transfers to [facility].   Maternal Data Has patient been taught Hand Expression?: Yes Does the patient have breastfeeding experience prior to this delivery?: No How long did the patient breastfeed?: wouldn't latch/tried  Feeding    LATCH Score Latch: Too sleepy or reluctant, no latch achieved, no sucking elicited.  Audible Swallowing: None  Type of Nipple: Everted at rest and after stimulation  Comfort (Breast/Nipple): Soft / non-tender  Hold (Positioning): Full assist, staff holds infant at breast  LATCH Score: 4   Lactation Tools Discussed/Used    Interventions Interventions: Adjust position;Assisted with latch;Support pillows;Skin to skin;Breast compression;Hand express  Discharge    Consult Status Consult Status: Follow-up from L&D Date: 07/07/22 Follow-up type: In-patient    Theodoro Kalata 07/06/2022, 9:48 PM

## 2022-07-06 NOTE — Anesthesia Preprocedure Evaluation (Signed)
Anesthesia Evaluation  Patient identified by MRN, date of birth, ID band Patient awake    Reviewed: Allergy & Precautions, Patient's Chart, lab work & pertinent test results  Airway Mallampati: II  TM Distance: >3 FB Neck ROM: Full    Dental no notable dental hx.    Pulmonary neg pulmonary ROS,    Pulmonary exam normal breath sounds clear to auscultation       Cardiovascular negative cardio ROS Normal cardiovascular exam Rhythm:Regular Rate:Normal     Neuro/Psych  Headaches, PSYCHIATRIC DISORDERS Anxiety Depression Bipolar Disorder    GI/Hepatic negative GI ROS, Neg liver ROS,   Endo/Other  negative endocrine ROS  Renal/GU negative Renal ROS  negative genitourinary   Musculoskeletal negative musculoskeletal ROS (+)   Abdominal   Peds negative pediatric ROS (+)  Hematology  (+) Blood dyscrasia, anemia , Hb 10.7, plt 190   Anesthesia Other Findings   Reproductive/Obstetrics (+) Pregnancy Section 2022                             Anesthesia Physical Anesthesia Plan  ASA: 2  Anesthesia Plan: Epidural   Post-op Pain Management:    Induction:   PONV Risk Score and Plan: 2  Airway Management Planned: Natural Airway  Additional Equipment: None  Intra-op Plan:   Post-operative Plan:   Informed Consent: I have reviewed the patients History and Physical, chart, labs and discussed the procedure including the risks, benefits and alternatives for the proposed anesthesia with the patient or authorized representative who has indicated his/her understanding and acceptance.       Plan Discussed with:   Anesthesia Plan Comments:         Anesthesia Quick Evaluation

## 2022-07-07 DIAGNOSIS — O34219 Maternal care for unspecified type scar from previous cesarean delivery: Secondary | ICD-10-CM | POA: Diagnosis not present

## 2022-07-07 DIAGNOSIS — F32A Depression, unspecified: Secondary | ICD-10-CM | POA: Diagnosis present

## 2022-07-07 LAB — RPR: RPR Ser Ql: NONREACTIVE

## 2022-07-07 LAB — CBC
HCT: 30.7 % — ABNORMAL LOW (ref 36.0–46.0)
Hemoglobin: 10.3 g/dL — ABNORMAL LOW (ref 12.0–15.0)
MCH: 27.8 pg (ref 26.0–34.0)
MCHC: 33.6 g/dL (ref 30.0–36.0)
MCV: 83 fL (ref 80.0–100.0)
Platelets: 186 10*3/uL (ref 150–400)
RBC: 3.7 MIL/uL — ABNORMAL LOW (ref 3.87–5.11)
RDW: 11.8 % (ref 11.5–15.5)
WBC: 9.6 10*3/uL (ref 4.0–10.5)
nRBC: 0 % (ref 0.0–0.2)

## 2022-07-07 NOTE — Anesthesia Postprocedure Evaluation (Signed)
Anesthesia Post Note  Patient: Lindsey Lee  Procedure(s) Performed: AN AD [place]     Patient location during evaluation: Mother Baby Anesthesia Type: Epidural Level of consciousness: awake and alert and oriented Pain management: satisfactory to patient Vital Signs Assessment: post-procedure vital signs reviewed and stable Respiratory status: respiratory function stable Cardiovascular status: stable Postop Assessment: no headache, no backache, epidural receding, patient able to bend at knees, no signs of nausea or vomiting, adequate PO intake and able to ambulate Anesthetic complications: no   No notable events documented.  Last Vitals:  Vitals:   07/06/22 2349 07/07/22 0422  BP: 112/73 109/78  Pulse: 65 (!) 58  Resp: 20 19  Temp: 36.9 C 36.9 C  SpO2: 99% 100%    Last Pain:  Vitals:   07/07/22 0427  TempSrc:   PainSc: 0-No pain   Pain Goal:                   [name]

## 2022-07-07 NOTE — Lactation Note (Signed)
This note was copied from a baby's chart. Lactation Consultation Note  Patient Name: Lindsey Lee [id number] Date: 07/07/2022 Reason for consult: Follow-up assessment;Early term 37-38.6wks;Infant weight loss;Breastfeeding assistance (1 % weight loss,) Age:25 hours Baby awake and hungry, heavy wet diaper changed, HNS yet. Attempted at the breast , no latch, baby would not open her mouth to latch. LC attempted an EBM appetizer without success to latch. LC recommended and showed parents how to pace feed, baby did well. Baby took 15 ml .  LC stressed the importance of the baby won't latch, go ahead and feed the baby EBM or donor milk from the bottle with purple nipple .  Post pump both breast for 15 mins and save milk for the next feeding.  Parents expressed understanding of LC plan.  Maternal Data Has patient been taught Hand Expression?: Yes  Feeding Mother's Current Feeding Choice: Breast Milk and Donor Milk Nipple Type: Extra Slow Flow  LATCH Score Latch: Too sleepy or reluctant, no latch achieved, no sucking elicited.  Audible Swallowing: None  Type of Nipple: Everted at rest and after stimulation (some areola edema)  Comfort (Breast/Nipple): Soft / non-tender  Hold (Positioning): Assistance needed to correctly position infant at breast and maintain latch.  LATCH Score: 5   Lactation Tools Discussed/Used Tools: Pump;Shells;Flanges Flange Size: 21 Breast pump type: Double-Electric Breast Pump Pump Education: Milk Storage  Interventions Interventions: Breast feeding basics reviewed;Hand express;Skin to skin;Assisted with latch;Breast massage;Reverse pressure;Breast compression;Adjust position;Support pillows;Position options;Shells;DEBP;Education  Discharge Pump: DEBP;Personal  Consult Status Consult Status: Follow-up Date: 07/08/22 Follow-up type: In-patient    [place] 07/07/2022, 2:08 PM

## 2022-07-07 NOTE — Social Work (Signed)
MOB was referred for history of depression/anxiety.  * Referral screened out by Clinical Social Worker because none of the following criteria appear to apply:  ~ History of anxiety/depression during this pregnancy, or of post-partum depression following prior delivery.  ~ Diagnosis of anxiety and/or depression within last 3 years OR * MOB's symptoms currently being treated with medication and/or therapy. Per chart review MOB is prescribed Zoloft.  CSW received and acknowledges consult for EDPS of 9.  Consult screened out due to 9 on EDPS does not warrant a CSW consult.  MOB whom scores are greater than 9/yes to question 10 on Edinburgh Postpartum Depression Screen warrants a CSW consult.   Please contact the Clinical Social Worker if needs arise or by MOB request.  Lindsey Lee, [place] Social Worker [phone]

## 2022-07-07 NOTE — Progress Notes (Signed)
   PPD #1 S/P VBAC  Live born female  Birth Weight: 5 lb 13.1 oz (2640 g) APGAR: 8, 9  Newborn Delivery   Birth date/time: 07/06/2022 20:44:00 Delivery type: VBAC, Vacuum Assisted     Baby name: Zella Richer  Delivering provider: Brien Few   Lacerations: 1st degree;Perineal   Feeding: breast; pumping for baby  Pain control at delivery: Epidural   S:  Reports feeling well. Very proud of successful VBAC.             Tolerating PO/No nausea or vomiting             Bleeding is light             Pain controlled with acetaminophen and ibuprofen (OTC)             Up ad lib/ambulatory/voiding without difficulties   O:  A & O x 3, in no apparent distress  Vitals:   07/06/22 2250 07/06/22 2349 07/07/22 0422 07/07/22 0857  BP: 108/74 112/73 109/78 106/76  Pulse: 71 65 (!) 58 76  Resp: 20 20 19 17   Temp: 98 F (36.7 C) 98.5 F (36.9 C) 98.4 F (36.9 C) 97.7 F (36.5 C)  TempSrc: Oral Oral  Oral  SpO2: 100% 99% 100% 99%  Weight:      Height:       Recent Labs    07/06/22 1417 07/07/22 0449  WBC 5.2 9.6  HGB 10.7* 10.3*  HCT 31.2* 30.7*  PLT 190 186    Blood type: --/--/O POS (10/29 1415)  Rubella: Immune (04/17 0000)   I&O: I/O last 3 completed shifts: In: -  Out: 505 [Urine:400; Blood:105]          No intake/output data recorded.  Gen: AAO x 3, NAD Abdomen: soft, non-tender, non-distended Fundus: firm, non-tender, U-1 Perineum: repair intact Lochia: small Extremities: no edema, no calf pain or tenderness   A/P:  PPD # 1 26 y.o., [id number]  Principal Problem:   Postpartum care following cesarean delivery 10/29  Doing well - stable status  Routine post partum orders Active Problems:   Encounter for induction of labor   VBAC (vaginal birth after Cesarean)   First degree perineal laceration  Discussed perineal care and comfort measures.    Anxiety and depression  Continue Zoloft as prescribed  Close PP F/U for mood  Anticipate discharge  tomorrow.   Suzan Nailer, MSN, CNM 07/07/2022, 1:20 PM

## 2022-07-07 NOTE — Lactation Note (Signed)
This note was copied from a baby's chart. Lactation Consultation Note  Patient Name: Lindsey Lee [id number] Date: 07/07/2022 Reason for consult: Initial assessment;Early term 37-38.6wks;Infant < 6lbs Age:25 hours Baby hasn't BF at all since birth. Baby had no interest in L&D nor does she now. She has been spitting up clear fluid. Has no interest to even suckle on gloved finger. Set mom up w/DEBP. Fitted flanges. Change size as needed. Mom would like to rest right now. LC showed mom how to use pump. Gave mom shells to wear today. Mom wants baby to assessed for tongue tie. Explained baby needs to be feeding and or crying to see how she is doing. This can be check before mom is d/c home. Mom is tired but also worried baby will choke if she goes to sleep. That's when parents takes turns watching baby if she is worried. Newborn feeding habits, STS, I&O reviewed. Mom encouraged to feed baby 8-12 times/24 hours and with feeding cues.   Attempt feeding w/cues, but if hasn't cued in 3 hrs try to feed. Suggested mom call for assistance when baby is cueing.  Maternal Data Has patient been taught Hand Expression?: Yes Does the patient have breastfeeding experience prior to this delivery?: Yes How long did the patient breastfeed?: 2 months  Feeding    LATCH Score Latch: Too sleepy or reluctant, no latch achieved, no sucking elicited.                  Lactation Tools Discussed/Used Tools: Shells;Pump;Flanges Flange Size: 21 Breast pump type: Double-Electric Breast Pump Pump Education: Setup, frequency, and cleaning Reason for Pumping: short shaft/semi flat/compressible Pumping frequency: q3hr  Interventions Interventions: Breast feeding basics reviewed;Hand express;Pre-pump if needed;Support pillows;Shells;DEBP;LC Services brochure;LPT handout/interventions  Discharge    Consult Status Consult Status: Follow-up Date: 07/07/22 Follow-up type: In-patient    Lindsey Lee,  [name] 07/07/2022, 3:22 AM

## 2022-07-08 MED ORDER — ACETAMINOPHEN 325 MG PO TABS: 650.0000 mg | ORAL_TABLET | ORAL | 1 refills | Status: AC | PRN

## 2022-07-08 MED ORDER — MEDROXYPROGESTERONE ACETATE 150 MG/ML IM SUSP
150.0000 mg | Freq: Once | INTRAMUSCULAR | Status: AC
  Administered 2022-07-08: 150 mg via INTRAMUSCULAR
  Filled 2022-07-08: qty 1

## 2022-07-08 MED ORDER — INFLUENZA VAC SPLIT QUAD 0.5 ML IM SUSY
0.5000 mL | PREFILLED_SYRINGE | INTRAMUSCULAR | Status: AC | PRN
  Administered 2022-07-08: 0.5 mL via INTRAMUSCULAR
  Filled 2022-07-08: qty 0.5

## 2022-07-08 MED ORDER — IBUPROFEN 600 MG PO TABS: 600.0000 mg | ORAL_TABLET | Freq: Four times a day (QID) | ORAL | 0 refills | Status: AC

## 2022-07-08 NOTE — Discharge Summary (Signed)
OB Discharge Summary  Patient Name: Lindsey Lee DOB: [birth year] MRN: [id number]  Date of admission: 07/06/2022 Delivering provider: Olivia Mackie   Admitting diagnosis: Encounter for induction of labor [Z34.90] Intrauterine pregnancy: [redacted]w[redacted]d     Secondary diagnosis: Patient Active Problem List   Diagnosis Date Noted   VBAC (vaginal birth after Cesarean) 07/07/2022   First degree perineal laceration 07/07/2022   Anxiety and depression 07/07/2022   Postpartum care following cesarean delivery 10/29 06/01/2021   Encounter for induction of labor 05/31/2021    Date of discharge: 07/08/2022   Discharge diagnosis: Principal Problem:   Postpartum care following cesarean delivery 10/29 Active Problems:   Encounter for induction of labor   VBAC (vaginal birth after Cesarean)   First degree perineal laceration   Anxiety and depression                                                            Augmentation: AROM and Pitocin Pain control: Epidural  Laceration:1st degree;Perineal  Complications: None  Hospital course:  Induction of Labor With Vaginal Delivery   26 y.o. yo [id number] at [redacted]w[redacted]d was admitted to the hospital 07/06/2022 for induction of labor.  Indication for induction:  IUGR .  Patient had an labor course complicated by TOLAC. Membrane Rupture Time/Date: 4:14 PM ,07/06/2022   Delivery Method:VBAC, Vacuum Assisted  Episiotomy: None  Lacerations:  1st degree;Perineal  Details of delivery can be found in separate delivery note. Patient had an uncomplicated postpartum course. Patient is discharged home 07/08/22.  Newborn Data: Birth date:07/06/2022  Birth time:8:44 PM  Gender:Female  Living status:Living  Apgars:8 ,9  Weight:2640 g   Physical exam  Vitals:   07/07/22 0857 07/07/22 1449 07/07/22 2056 07/08/22 0610  BP: 106/76 109/71 106/80 (!) 88/57  Pulse: 76 80 75 77  Resp: 17 18 17 18   Temp: 97.7 F (36.5 C) 98.5 F (36.9 C) 98.1 F (36.7 C) 98 F (36.7  C)  TempSrc: Oral Oral Oral Oral  SpO2: 99%  99% 100%  Weight:      Height:       General: alert, cooperative, and no distress Lochia: appropriate Uterine Fundus: firm Perineum: repair intact, no edema DVT Evaluation: No evidence of DVT seen on physical exam.  Labs: Lab Results  Component Value Date   WBC 9.6 07/07/2022   HGB 10.3 (L) 07/07/2022   HCT 30.7 (L) 07/07/2022   MCV 83.0 07/07/2022   PLT 186 07/07/2022      07/07/2022   12:34 AM 06/01/2021   12:00 PM  Edinburgh Postnatal Depression Scale Screening Tool  I have been able to laugh and see the funny side of things. 0 0  I have looked forward with enjoyment to things. 1 1  I have blamed myself unnecessarily when things went wrong. 2 2  I have been anxious or worried for no good reason. 1 3  I have felt scared or panicky for no good reason. 1 3  Things have been getting on top of me. 2 2  I have been so unhappy that I have had difficulty sleeping. 0 1  I have felt sad or miserable. 1 3  I have been so unhappy that I have been crying. 1 2  The thought of harming myself has occurred to me. 0  0  Edinburgh Postnatal Depression Scale Total 9 17   Discharge instructions:  per After Visit Summary  After Visit Meds:  Allergies as of 07/08/2022   No Known Allergies      Medication List     STOP taking these medications    sertraline 50 MG tablet Commonly known as: ZOLOFT       TAKE these medications    acetaminophen 325 MG tablet Commonly known as: Tylenol Take 2 tablets (650 mg total) by mouth every 4 (four) hours as needed (for pain scale < 4).   ibuprofen 600 MG tablet Commonly known as: ADVIL Take 1 tablet (600 mg total) by mouth every 6 (six) hours.       Activity: Advance as tolerated. Pelvic rest for 6 weeks.   Newborn Data: Live born female  Birth Weight: 5 lb 13.1 oz (2640 g) APGAR: 8, 9  Newborn Delivery   Birth date/time: 07/06/2022 20:44:00 Delivery type: VBAC, Vacuum Assisted      Named [place] Baby Feeding: Bottle and Breast Disposition:home with mother  Delivery Report:  Review the Delivery Report for details.    Follow up:  Follow-up Information     Brien Few, MD. Schedule an appointment as soon as possible for a visit in 2 week(s).   Specialty: Obstetrics and Gynecology Contact information: [place] [address] [place], CNM, MSN 07/08/2022, 12:02 PM

## 2022-07-08 NOTE — Lactation Note (Signed)
This note was copied from a baby's chart. Lactation Consultation Note  Patient Name: Lindsey Lee DHRCB'U Date: 07/08/2022 Age: 25 hours  Reason for consult: Follow-up assessment;Early term 37-38.6wks;Infant weight loss;Nipple pain/trauma (5 % weight loss , LC reviewed and updated the doc flow sheets per mom . per mom desired to wait to working on latching at home. has been pumping consistently with the DEBP and volume. Breast are feeling aching and nipples sore. Coconut oil provided.) LC reviewed BF D/C teaching and LC resources if needed.  LC reviewed supply and demand / importance of being consistent with pumping if baby isn't latching.   Maternal Data Does the patient have breastfeeding experience prior to this delivery?: Yes  Feeding Mother's Current Feeding Choice: Breast Milk and Donor Milk Nipple Type: Extra Slow Flow  LATCH Score - mom declined latching this am. Per mom will working on latching at home .    Lactation Tools Discussed/Used Tools: Pump;Flanges;Coconut oil;Shells Flange Size: 21;24 Breast pump type: Manual;Double-Electric Breast Pump Pump Education: Milk Storage  Interventions Interventions: Breast feeding basics reviewed;Coconut oil;Shells;Hand pump;DEBP;Education;LC Services brochure  Discharge Discharge Education: Engorgement and breast care;Warning signs for feeding baby;Outpatient recommendation;Other (comment) (per mom will be seeing [place] from [place]) Pump: DEBP;Personal;Manual WIC Program: No  Consult Status Consult Status: Complete Date: 07/08/22    Myer Haff 07/08/2022, 10:57 AM

## 2022-07-08 NOTE — Progress Notes (Signed)
Called about patient back pain following uncomplicated vaginal  delivery with epidural.  After review of chart patient had functional lumbar epidural infusion with one pass for two hours prior to delivery.   On exam she has well healed epidural puncture site with no signs of rubor, calor or tumor and patient is afebrile.  Paraspinous muscles tender to touch to sacrum.  Motor and sensory intact, and patient ambulates without  difficulty.   I have reassured patient in the presence of her husband  that this is not unusual, should it last greater than one week with change in symptoms or signs of inflammation to contact us. She seems reassured.

## 2022-07-08 NOTE — Progress Notes (Signed)
Dr. Ambrose Pancoast, Anesthesiologist, notified of persistent lower back pain near the epidural site. Pt is 35 hours out from delivery with complaints of sore, tender, and aching pain of 4-6/10 that sometimes involves lower extremity tingling. MD to round on patient this morning.    Rocky Crafts, RN 07/08/22

## 2022-07-16 ENCOUNTER — Telehealth (HOSPITAL_COMMUNITY): Payer: Self-pay | Admitting: *Deleted

## 2022-07-16 NOTE — Telephone Encounter (Signed)
Attempted hospital discharge follow-up call. Left message for patient to return RN call with any questions or concerns. Deforest Hoyles, RN, 07/16/22, [phone]

## 2022-09-05 ENCOUNTER — Telehealth: Payer: BC Managed Care – PPO | Admitting: Nurse Practitioner

## 2022-09-05 DIAGNOSIS — J111 Influenza due to unidentified influenza virus with other respiratory manifestations: Secondary | ICD-10-CM | POA: Diagnosis not present

## 2022-09-05 NOTE — Progress Notes (Signed)
Virtual Visit Consent   Lindsey Lee, you are scheduled for a virtual visit with Mary-Margaret Daphine Deutscher, FNP, a [facility] provider, today.     Just as with appointments in the office, your consent must be obtained to participate.  Your consent will be active for this visit and any virtual visit you may have with one of our providers in the next 365 days.     If you have a MyChart account, a copy of this consent can be sent to you electronically.  All virtual visits are billed to your insurance company just like a traditional visit in the office.    As this is a virtual visit, video technology does not allow for your provider to perform a traditional examination.  This may limit your provider's ability to fully assess your condition.  If your provider identifies any concerns that need to be evaluated in person or the need to arrange testing (such as labs, EKG, etc.), we will make arrangements to do so.     Although advances in technology are sophisticated, we cannot ensure that it will always work on either your end or our end.  If the connection with a video visit is poor, the visit may have to be switched to a telephone visit.  With either a video or telephone visit, we are not always able to ensure that we have a secure connection.     I need to obtain your verbal consent now.   Are you willing to proceed with your visit today? YES   Lindsey Lee has provided verbal consent on 09/05/2022 for a virtual visit (video or telephone).   Mary-Margaret Daphine Deutscher, FNP   Date: 09/05/2022 5:42 PM   Virtual Visit via Video Note   I, Mary-Margaret Daphine Deutscher, connected with Lindsey Lee ([id number], 05-Sep-1996) on 09/05/22 at  6:00 PM EST by a video-enabled telemedicine application and verified that I am speaking with the correct person using two identifiers.  Location: Patient: Virtual Visit Location Patient: Home Provider: Virtual Visit Location Provider: Mobile   I discussed the  limitations of evaluation and management by telemedicine and the availability of in person appointments. The patient expressed understanding and agreed to proceed.    History of Present Illness: Lindsey Lee is a 26 y.o. who identifies as a female who was assigned female at birth, and is being seen today for influenza.  HPI: Influenza This is a new problem. Episode onset: 4 days. The problem has been waxing and waning. Associated symptoms include congestion, coughing, myalgias and a sore throat. Associated symptoms comments: Fever 101.6. The symptoms are aggravated by swallowing. Treatments tried: dayquil, nyquil and theraflu. The treatment provided mild relief.   Covid test negative Review of Systems  HENT:  Positive for congestion and sore throat.   Respiratory:  Positive for cough.   Musculoskeletal:  Positive for myalgias.    Problems:  Patient Active Problem List   Diagnosis Date Noted   VBAC (vaginal birth after Cesarean) 07/07/2022   First degree perineal laceration 07/07/2022   Anxiety and depression 07/07/2022   Postpartum care following cesarean delivery 10/29 06/01/2021   Encounter for induction of labor 05/31/2021    Allergies: No Known Allergies Medications:  Current Outpatient Medications:    acetaminophen (TYLENOL) 325 MG tablet, Take 2 tablets (650 mg total) by mouth every 4 (four) hours as needed (for pain scale < 4)., Disp: 30 tablet, Rfl: 1   ibuprofen (ADVIL) 600 MG tablet, Take 1 tablet (600 mg total) by  mouth every 6 (six) hours., Disp: 30 tablet, Rfl: 0  Observations/Objective: Patient is well-developed, well-nourished in no acute distress.  Resting comfortably  at home.  Head is normocephalic, atraumatic.  No labored breathing.  Speech is clear and coherent with logical content.  Patient is alert and oriented at baseline.  Wet cough  Assessment and Plan:  Lindsey Lee in today with chief complaint of Influenza   1. Influenza 1. Take meds  as prescribed 2. Use a cool mist humidifier especially during the winter months and when heat has been humid. 3. Use saline nose sprays frequently 4. Saline irrigations of the nose can be very helpful if done frequently.  * 4X daily for 1 week*  * Use of a nettie pot can be helpful with this. Follow directions with this* 5. Drink plenty of fluids 6. Keep thermostat turn down low 7.For any cough or congestion- nyquil and dayquil 8. For fever or aces or pains- take tylenol or ibuprofen appropriate for age and weight.  * for fevers greater than 101 orally you may alternate ibuprofen and tylenol every  3 hours.   Out of treatment window for tamiflu since have been sick for over 4 days.  Follow Up Instructions: I discussed the assessment and treatment plan with the patient. The patient was provided an opportunity to ask questions and all were answered. The patient agreed with the plan and demonstrated an understanding of the instructions.  A copy of instructions were sent to the patient via MyChart.  The patient was advised to call back or seek an in-person evaluation if the symptoms worsen or if the condition fails to improve as anticipated.  Time:  I spent 6 minutes with the patient via telehealth technology discussing the above problems/concerns.    Mary-Margaret Daphine Deutscher, FNP

## 2022-09-05 NOTE — Patient Instructions (Signed)
  Lindsey Lee, thank you for joining Bennie Pierini, FNP for today's virtual visit.  While this provider is not your primary care provider (PCP), if your PCP is located in our provider database this encounter information will be shared with them immediately following your visit.   A [facility] MyChart account gives you access to today's visit and all your visits, tests, and labs performed at [facility] " click here if you don't have a [facility] MyChart account or go to [email or web address]  Consent: (Patient) Lindsey Lee provided verbal consent for this virtual visit at the beginning of the encounter.  Current Medications:  Current Outpatient Medications:    acetaminophen (TYLENOL) 325 MG tablet, Take 2 tablets (650 mg total) by mouth every 4 (four) hours as needed (for pain scale < 4)., Disp: 30 tablet, Rfl: 1   ibuprofen (ADVIL) 600 MG tablet, Take 1 tablet (600 mg total) by mouth every 6 (six) hours., Disp: 30 tablet, Rfl: 0   Medications ordered in this encounter:  No orders of the defined types were placed in this encounter.    *If you need refills on other medications prior to your next appointment, please contact your pharmacy*  Follow-Up: Call back or seek an in-person evaluation if the symptoms worsen or if the condition fails to improve as anticipated.  [facility] [phone]  Other Instructions 1. Take meds as prescribed 2. Use a cool mist humidifier especially during the winter months and when heat has been humid. 3. Use saline nose sprays frequently 4. Saline irrigations of the nose can be very helpful if done frequently.  * 4X daily for 1 week*  * Use of a nettie pot can be helpful with this. Follow directions with this* 5. Drink plenty of fluids 6. Keep thermostat turn down low 7.For any cough or congestion- nyquil or dayquil. 8. For fever or aces or pains- take tylenol or ibuprofen appropriate for age and  weight.  * for fevers greater than 101 orally you may alternate ibuprofen and tylenol every  3 hours.      If you have been instructed to have an in-person evaluation today at a local Urgent Care facility, please use the link below. It will take you to a list of all of our available [facility], including address, phone number and hours of operation. Please do not delay care.  [facility]  If you or a family member do not have a primary care provider, use the link below to schedule a visit and establish care. When you choose a [facility] primary care physician or advanced practice provider, you gain a long-term partner in health. Find a Primary Care Provider  Learn more about [facility]'s in-office and virtual care options: [facility] - Get Care Now

## 2023-02-09 ENCOUNTER — Ambulatory Visit: Payer: BC Managed Care – PPO | Admitting: Family Medicine

## 2023-02-10 ENCOUNTER — Encounter: Payer: Self-pay | Admitting: Family Medicine

## 2023-02-10 ENCOUNTER — Ambulatory Visit: Payer: BC Managed Care – PPO | Admitting: Family Medicine

## 2023-02-10 VITALS — BP 112/62 | HR 75 | Temp 98.4°F | Ht 62.0 in | Wt 109.4 lb

## 2023-02-10 DIAGNOSIS — G43E09 Chronic migraine with aura, not intractable, without status migrainosus: Secondary | ICD-10-CM

## 2023-02-10 MED ORDER — NURTEC 75 MG PO TBDP: 75.0000 mg | ORAL_TABLET | ORAL | 1 refills | Status: AC

## 2023-02-10 NOTE — Progress Notes (Signed)
Subjective:  HPI: Lindsey Lee is a 27 y.o. female presenting on 02/10/2023 for Headache (Pt c/o headaches since October. Pt states pain is at back of head and radiates down neck. )   Headache    Patient is in today for migraines, these have been ongoing for many years. They are gradually getting worse and more frequent as she had to stop taking propanolol and nurtec when she became pregnant. She is not currently pregnant or breastfeeding but does have a 7 month and 52 month old at home.    MIGRAINES Duration: chronic Onset: gradual Severity: severe Quality: sharp, pressure-like, and throbbing Frequency:  daily Location: unilateral front to back Headache duration: evenings to morning Radiation: yes to neck Time of day headache occurs: evening Alleviating factors: none Aggravating factors: noise, light Headache status at time of visit: asymptomatic Treatments attempted: Treatments attempted: rest, ice, heat, APAP, ibuprofen, aleve", excedrine, and propranolol  Improved with Nurtec prior to pregnancy Aura: yes Nausea:  no Vomiting: no Photophobia:  yes Phonophobia:  yes Effect on social functioning:  yes Numbers of missed days of school/work each month:  Confusion:  no Gait disturbance/ataxia:  no Behavioral changes:  no Fevers:  no   Review of Systems  Neurological:  Positive for headaches.  All other systems reviewed and are negative.   Relevant past medical history reviewed and updated as indicated.   Past Medical History:  Diagnosis Date   Anxiety    Depression    possible bipolar disorder (diagnosed in [place])   Migraines      Past Surgical History:  Procedure Laterality Date   CESAREAN SECTION N/A 06/01/2021   Procedure: CESAREAN SECTION;  Surgeon: Olivia Mackie, MD;  Location: [facility] LD [facility]S;  Service: Obstetrics;  Laterality: N/A;    Allergies and medications reviewed and updated.   Current Outpatient Medications:    Rimegepant Sulfate  (NURTEC) 75 MG TBDP, Take 1 tablet (75 mg total) by mouth every other day., Disp: 30 tablet, Rfl: 1   acetaminophen (TYLENOL) 325 MG tablet, Take 2 tablets (650 mg total) by mouth every 4 (four) hours as needed (for pain scale < 4)., Disp: 30 tablet, Rfl: 1   ibuprofen (ADVIL) 600 MG tablet, Take 1 tablet (600 mg total) by mouth every 6 (six) hours., Disp: 30 tablet, Rfl: 0  No Known Allergies  Objective:   BP 112/62   Pulse 75   Temp 98.4 F (36.9 C)   Ht 5\' 2"  (1.575 m)   Wt 109 lb 6.4 oz (49.6 kg)   SpO2 99%   BMI 20.01 kg/m      02/10/2023   11:39 AM 07/08/2022    6:10 AM 07/07/2022    8:56 PM  Vitals with BMI  Height 5\' 2"     Weight 109 lbs 6 oz    BMI 20    Systolic 112 88 106  Diastolic 62 57 80  Pulse 75 77 75     Physical Exam Vitals and nursing note reviewed.  Constitutional:      Appearance: Normal appearance. She is normal weight.  HENT:     Head: Normocephalic and atraumatic.  Cardiovascular:     Pulses: Normal pulses.  Skin:    General: Skin is warm and dry.  Neurological:     General: No focal deficit present.     Mental Status: She is alert and oriented to person, place, and time. Mental status is at baseline.  Psychiatric:  Mood and Affect: Mood normal.        Behavior: Behavior normal.        Thought Content: Thought content normal.        Judgment: Judgment normal.     Assessment & Plan:  Chronic migraine with aura without status migrainosus, not intractable Assessment & Plan: Chronic. Uncontrolled. Will restart Nurtec 75mg  QOD. SNNOOP10 negative.  Orders: -     Ambulatory referral to Neurology  Other orders -     Nurtec; Take 1 tablet (75 mg total) by mouth every other day.  Dispense: 30 tablet; Refill: 1     Follow up plan: Return if symptoms worsen or fail to improve.  Park Meo, FNP

## 2023-02-10 NOTE — Assessment & Plan Note (Signed)
Chronic. Uncontrolled. Will restart Nurtec 75mg  QOD. SNNOOP10 negative.

## 2023-02-13 ENCOUNTER — Telehealth: Payer: Self-pay

## 2023-02-13 NOTE — Telephone Encounter (Signed)
Lindsey Lee (Key: [id number])   Your information has been submitted to [employer]. To check for an updated outcome later, reopen this PA request from your dashboard.  If [employer] has not responded to your request within 24 hours, contact [employer] at [phone]. If you think there may be a problem with your PA request, use our live chat feature at the bottom right.

## 2023-02-25 NOTE — Telephone Encounter (Signed)
   Outcome Lindsey Lee (Key: [id number])   Approved: Nurtec 75MG   on June 7 Your PA request has been approved. Additional information will be provided in the approval

## 2023-03-20 ENCOUNTER — Other Ambulatory Visit: Payer: BC Managed Care – PPO

## 2023-03-23 ENCOUNTER — Encounter: Payer: BC Managed Care – PPO | Admitting: Family Medicine

## 2023-07-14 ENCOUNTER — Encounter: Payer: Self-pay | Admitting: Family Medicine

## 2023-07-14 ENCOUNTER — Ambulatory Visit: Payer: BC Managed Care – PPO | Admitting: Family Medicine

## 2023-07-14 VITALS — BP 118/62 | HR 93 | Temp 98.4°F | Ht 62.0 in | Wt 107.6 lb

## 2023-07-14 DIAGNOSIS — F331 Major depressive disorder, recurrent, moderate: Secondary | ICD-10-CM | POA: Diagnosis not present

## 2023-07-14 DIAGNOSIS — F411 Generalized anxiety disorder: Secondary | ICD-10-CM | POA: Diagnosis not present

## 2023-07-14 MED ORDER — LAMOTRIGINE 25 MG PO TABS: 25.0000 mg | ORAL_TABLET | Freq: Every day | ORAL | 3 refills | Status: DC

## 2023-07-14 MED ORDER — SERTRALINE HCL 50 MG PO TABS: 50.0000 mg | ORAL_TABLET | Freq: Every day | ORAL | 3 refills | Status: DC

## 2023-07-14 NOTE — Progress Notes (Signed)
Subjective:    Patient ID: Lindsey Lee, female    DOB: 01/02/1996, 27 y.o.   MRN: [id number]  HPI Patient states she is dealing with a lot of anxiety and depression.  This has been going on for quite some time.  She feels like it started after the birth of her first child.  She felt like she was dealing with postpartum depression.  She quickly became pregnant with her second child.  Since then the symptoms will only worsen.  She reports trouble sleeping.  She reports lack of energy.  She reports problems with anxiety.  She reports excessive feelings of guilt.  She states that she feels like she is a bad mom.  She loses her temper quickly.  She gets frustrated very quickly.  She reports feelings of sadness and anhedonia.  She denies any impulsive behavior.  She denies any increase in goal-directed behavior.  She does occasionally get racing thoughts.  She states it is hard for her to turn her mind off at night.  However there is no evidence of tangential speech today.  She states that in [place] she did really well on a combination of Zoloft and lamotrigine.  She would like to get started on this medication because she feels like she is spiraling. Past Medical History:  Diagnosis Date   Anxiety    Depression    possible bipolar disorder (diagnosed in [place])   Migraines    Past Surgical History:  Procedure Laterality Date   CESAREAN SECTION N/A 06/01/2021   Procedure: CESAREAN SECTION;  Surgeon: Olivia Mackie, MD;  Location: [facility] LD [facility]S;  Service: Obstetrics;  Laterality: N/A;      Review of Systems  All other systems reviewed and are negative.      Objective:   Physical Exam Vitals reviewed.  Constitutional:      General: She is not in acute distress.    Appearance: Normal appearance. She is normal weight. She is not ill-appearing, toxic-appearing or diaphoretic.  Cardiovascular:     Rate and Rhythm: Normal rate and regular rhythm.     Pulses: Normal pulses.      Heart sounds: Normal heart sounds.  Pulmonary:     Effort: Pulmonary effort is normal.     Breath sounds: Normal breath sounds.  Neurological:     General: No focal deficit present.     Mental Status: She is alert and oriented to person, place, and time. Mental status is at baseline.     Cranial Nerves: No cranial nerve deficit.     Motor: No weakness.     Gait: Gait normal.  Psychiatric:        Mood and Affect: Mood normal.        Behavior: Behavior normal.        Thought Content: Thought content normal.        Judgment: Judgment normal.           Assessment & Plan:   GAD (generalized anxiety disorder)  Moderate episode of recurrent major depressive disorder (HCC) I have not witnessed any behavior consistent with bipolar disorder.  I recommended starting sertraline 50 mg a day and reassess in 4 to 5 weeks.  Patient states that she did really well with the combination of lamotrigine as a mood stabilizer and the Zoloft.  She is not breast-feeding.  She is on Depo for birth control.  Therefore I see no reason why we cannot try this.  I will start the patient on  low-dose lamotrigine 25 mg a day and reassess the patient in 4 weeks.

## 2023-08-10 ENCOUNTER — Ambulatory Visit: Payer: Self-pay | Admitting: Family Medicine

## 2023-08-10 NOTE — Telephone Encounter (Signed)
Copied from CRM [id number]. Topic: Clinical - Red Word Triage >> Aug 10, 2023 11:19 AM Alphonzo Lemmings O wrote: [facility] that prompted transfer to Nurse Triage: patient calling about being sent home due to chest hurt and body ached head ache  Chief Complaint: Chest pain  With generalized body aches/headaches/blurry vision Symptoms: Chest pain near sternum and to the left of the sternum--pt states worse with breathing Frequency: Just started this morning--constant pain in chest and generally all over--getting worse throughout the day Pertinent Negatives: Patient denies fevers Disposition: [] ED /[x] Urgent Care (no appt availability in office) / [] Appointment(In office/virtual)/ []  [place] [facility]/ [] Home Care/ [] Refused Recommended Disposition /[]  Mobile Bus/ []  Follow-up with PCP Additional Notes: Patient presenting with chest pain, center of the sternum and to the left of the sternum.  Patient states this started this morning with a  runny nose and it got worse throughout the day.  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  She went to the principals office, had an episode of blurry vision walking there around 10:30am this morning, Pt did not have any fevers noted, and states they sent her home.  Since pt had blurry vision and lightheadedness, her husband picked her up from work.  Patient states that she feels this way every year with the weather change.  She is advised that given her symptoms and no availability in the office that she go to an Emergency Room or at least an Urgent Care for further evaluation.  Patient is advised that if anything changes or gets worse to call an ambulance or go to the emergency room.  Patient states that her husband will be with her.  Patient denies any difficulty breathing, fevers, or any type of trauma/injury.  She states she will probably end up going to an urgent care.  Patient is  advised again if anything changes or worsens, to go to the ER or call 911. Pt verbalized understanding.   Reason for Disposition  Dizziness or lightheadedness  Answer Assessment - Initial Assessment Questions 1. LOCATION: "Where does it hurt?"       Chest--center and to the left of the sternum 2. RADIATION: "Does the pain go anywhere else?" (e.g., into neck, jaw, arms, back)     Denies 3. ONSET: "When did the chest pain begin?" (Minutes, hours or days)      This morning pt woke up with chest pain and runny nose & got worse throughout the day 4. PATTERN: "Does the pain come and go, or has it been constant since it started?"  "Does it get worse with exertion?"      "Constantly been nagging" 5. DURATION: "How long does it last" (e.g., seconds, minutes, hours)     Constant--worse with breathing 6. SEVERITY: "How bad is the pain?"  (e.g., Scale 1-10; mild, moderate, or severe)    - MILD (1-3): doesn't interfere with normal activities     - MODERATE (4-7): interferes with normal activities or awakens from sleep    - SEVERE (8-10): excruciating pain, unable to do any normal activities       4 7. CARDIAC RISK FACTORS: "Do you have any history of heart problems or risk factors for heart disease?" (e.g., angina, prior heart attack; diabetes, high blood pressure, high cholesterol, smoker, or strong family history of heart disease)     Pt denies 8. PULMONARY RISK FACTORS: "Do you have  any history of lung disease?"  (e.g., blood clots in lung, asthma, emphysema, birth control pills)     Pt on depo 9. CAUSE: "What do you think is causing the chest pain?"     Pt states she is a Runner, broadcasting/film/video inside her school building it is very cold and she isnt sure if this may have made it worse 10. OTHER SYMPTOMS: "Do you have any other symptoms?" (e.g., dizziness, nausea, vomiting, sweating, fever, difficulty breathing, cough)       Nausea, headaches, achy all over, chills, one episode of blurry vision around  10:30am 11. PREGNANCY: "Is there any chance you are pregnant?" "When was your last menstrual period?"       Pt states she is on depo  Protocols used: Chest Pain-A-AH

## 2023-08-11 ENCOUNTER — Encounter: Payer: Self-pay | Admitting: Family Medicine

## 2023-08-11 ENCOUNTER — Ambulatory Visit: Payer: BC Managed Care – PPO | Admitting: Family Medicine

## 2023-08-11 VITALS — BP 120/68 | HR 86 | Temp 98.3°F | Ht 62.0 in | Wt 106.8 lb

## 2023-08-11 DIAGNOSIS — J029 Acute pharyngitis, unspecified: Secondary | ICD-10-CM

## 2023-08-11 DIAGNOSIS — F331 Major depressive disorder, recurrent, moderate: Secondary | ICD-10-CM | POA: Diagnosis not present

## 2023-08-11 LAB — INFLUENZA A AND B AG, IMMUNOASSAY
INFLUENZA A ANTIGEN: NOT DETECTED
INFLUENZA B ANTIGEN: NOT DETECTED

## 2023-08-11 MED ORDER — SERTRALINE HCL 50 MG PO TABS: 50.0000 mg | ORAL_TABLET | Freq: Every day | ORAL | 3 refills | Status: AC

## 2023-08-11 MED ORDER — LAMOTRIGINE 25 MG PO TABS: 25.0000 mg | ORAL_TABLET | Freq: Two times a day (BID) | ORAL | 3 refills | Status: DC

## 2023-08-11 NOTE — Progress Notes (Signed)
Subjective:    Patient ID: Lindsey Lee, female    DOB: 25-Oct-1995, 27 y.o.   MRN: [id number]  HPI 07/14/23 Patient states she is dealing with a lot of anxiety and depression.  This has been going on for quite some time.  She feels like it started after the birth of her first child.  She felt like she was dealing with postpartum depression.  She quickly became pregnant with her second child.  Since then the symptoms will only worsen.  She reports trouble sleeping.  She reports lack of energy.  She reports problems with anxiety.  She reports excessive feelings of guilt.  She states that she feels like she is a bad mom.  She loses her temper quickly.  She gets frustrated very quickly.  She reports feelings of sadness and anhedonia.  She denies any impulsive behavior.  She denies any increase in goal-directed behavior.  She does occasionally get racing thoughts.  She states it is hard for her to turn her mind off at night.  However there is no evidence of tangential speech today.  She states that in [place] she did really well on a combination of Zoloft and lamotrigine.  She would like to get started on this medication because she feels like she is spiraling.  At that time, my plan was: I have not witnessed any behavior consistent with bipolar disorder.  I recommended starting sertraline 50 mg a day and reassess in 4 to 5 weeks.  Patient states that she did really well with the combination of lamotrigine as a mood stabilizer and the Zoloft.  She is not breast-feeding.  She is on Depo for birth control.  Therefore I see no reason why we cannot try this.  I will start the patient on low-dose lamotrigine 25 mg a day and reassess the patient in 4 weeks.  08/11/23 Patient states she is doing better.  She is not losing her temper as fast.  She is not as anxious.  She feels less depressed.  Her husband states that her mood is better.  She still reports sadness and anhedonia.  This seems to be exacerbated by  the time of year.  The darkness certainly seems to affect her mood.  Also earlier this week she had a sore throat and bodyaches.  This gradually improved over a period 48 hours.  Her strep test today is negative and her flu test today is negative. Past Medical History:  Diagnosis Date   Anxiety    Depression    possible bipolar disorder (diagnosed in [place])   Migraines    Past Surgical History:  Procedure Laterality Date   CESAREAN SECTION N/A 06/01/2021   Procedure: CESAREAN SECTION;  Surgeon: Olivia Mackie, MD;  Location: [facility];  Service: Obstetrics;  Laterality: N/A;      Review of Systems  All other systems reviewed and are negative.      Objective:   Physical Exam Vitals reviewed.  Constitutional:      General: She is not in acute distress.    Appearance: Normal appearance. She is normal weight. She is not ill-appearing, toxic-appearing or diaphoretic.  Cardiovascular:     Rate and Rhythm: Normal rate and regular rhythm.     Pulses: Normal pulses.     Heart sounds: Normal heart sounds.  Pulmonary:     Effort: Pulmonary effort is normal.     Breath sounds: Normal breath sounds.  Neurological:     General: No focal deficit  present.     Mental Status: She is alert and oriented to person, place, and time. Mental status is at baseline.     Cranial Nerves: No cranial nerve deficit.     Motor: No weakness.     Gait: Gait normal.  Psychiatric:        Mood and Affect: Mood normal.        Behavior: Behavior normal.        Thought Content: Thought content normal.        Judgment: Judgment normal.           Assessment & Plan:   Sore throat - Plan: STREP GROUP A AG, W/REFLEX TO CULT, Influenza A and B Ag, Immunoassay  Moderate episode of recurrent major depressive disorder (HCC) I believe the patient sore throat is a virus and this seems to be subsiding on its own.  Flu test and strep test are negative.  I am glad that her depression is improving.  I  recommended continue Zoloft 50 mg a day but I would increase lamotrigine to 25 mg twice daily.  Reassess via telephone in 1 month

## 2023-08-13 LAB — CULTURE, GROUP A STREP
Micro Number: 15802061
SPECIMEN QUALITY:: ADEQUATE

## 2023-08-13 LAB — STREP GROUP A AG, W/REFLEX TO CULT: Streptococcus Group A AG: NOT DETECTED

## 2023-10-09 ENCOUNTER — Other Ambulatory Visit: Payer: Self-pay | Admitting: Family Medicine

## 2023-10-09 DIAGNOSIS — F331 Major depressive disorder, recurrent, moderate: Secondary | ICD-10-CM

## 2023-12-09 IMAGING — US US OB < 14 WEEKS - US OB TV
1 series · 15 of 28 positions shown · non-contrast
Comparison: None.

CLINICAL DATA: Abdominal cramping

EXAM:
OBSTETRIC <14 WK US AND TRANSVAGINAL OB US
TECHNIQUE: Both transabdominal and transvaginal ultrasound examinations were
performed for complete evaluation of the gestation as well as the
maternal uterus, adnexal regions, and pelvic cul-de-sac.
Transvaginal technique was performed to assess early pregnancy.

[Series 1: us ob < 14 weeks - us ob tv · 15 of 51 slices shown]
[im 1/51]
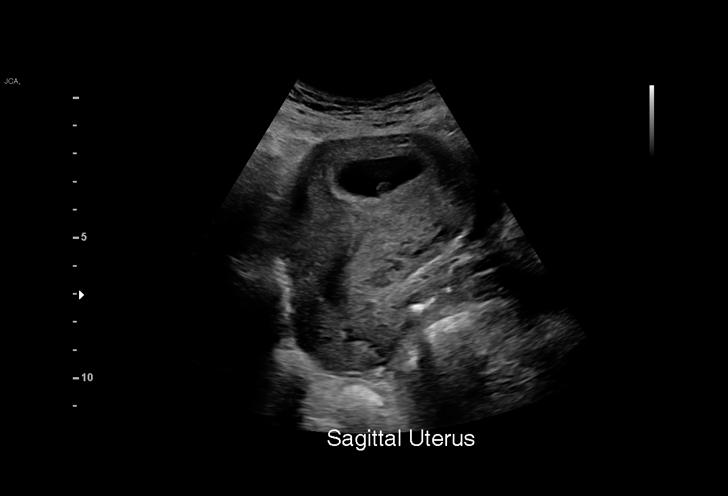
[im 4/51]
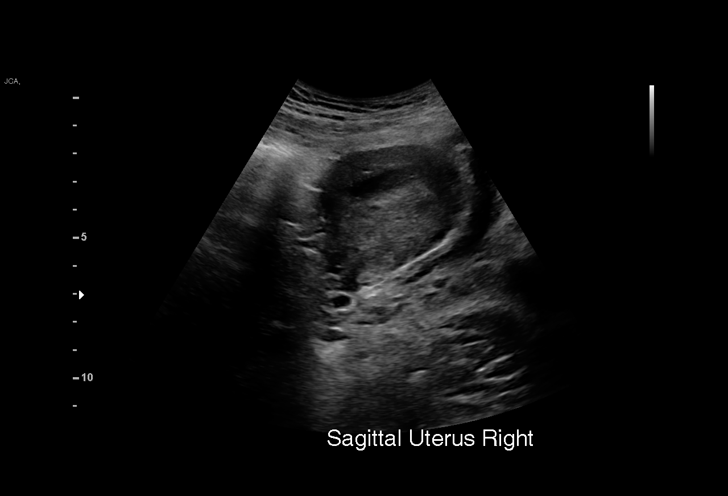
[im 8/51]
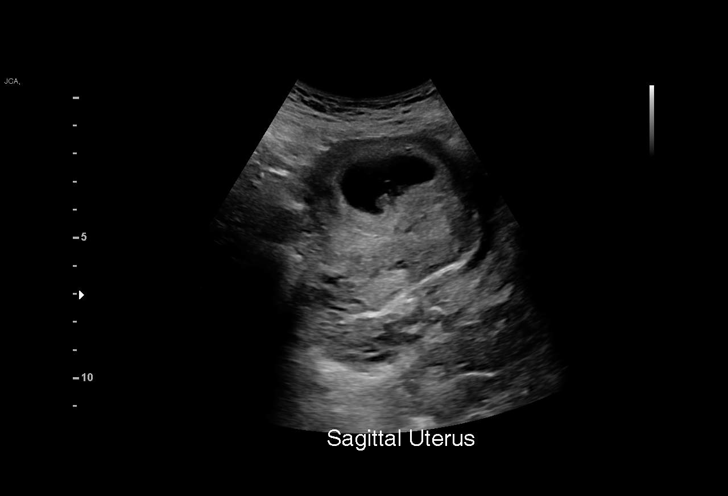
[im 12/51]
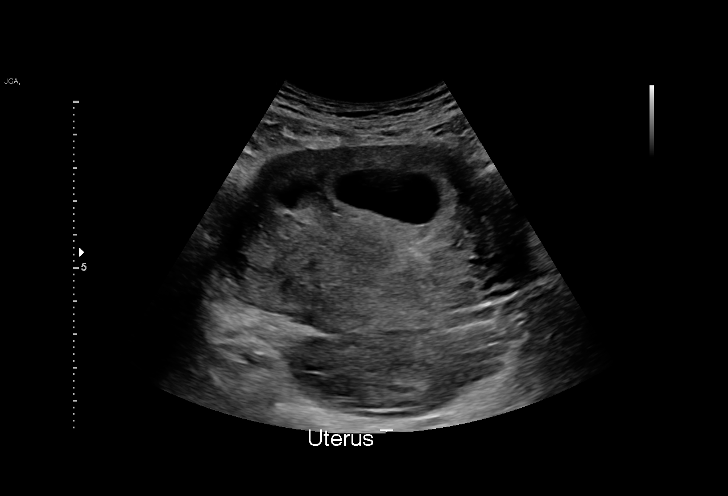
[im 15/51]
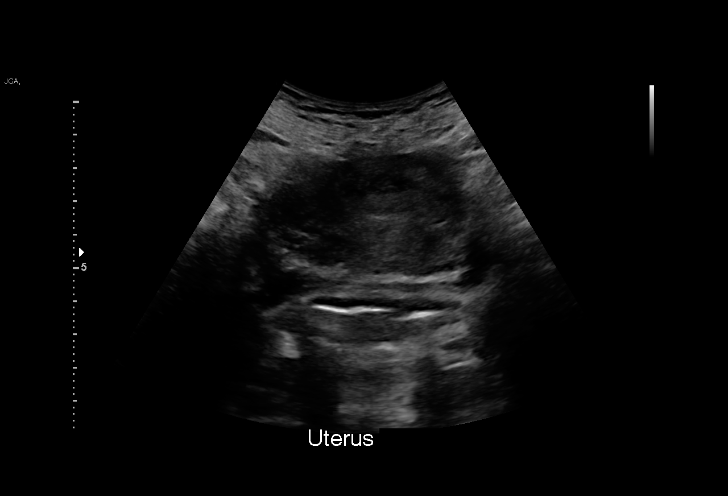
[im 19/51]
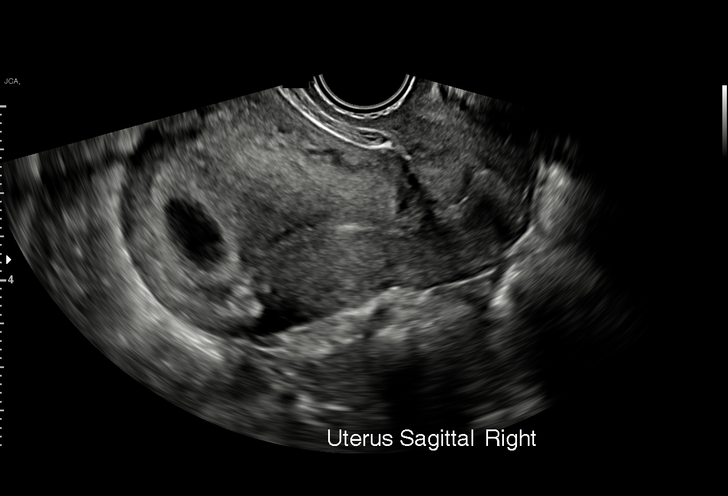
[im 23/51]
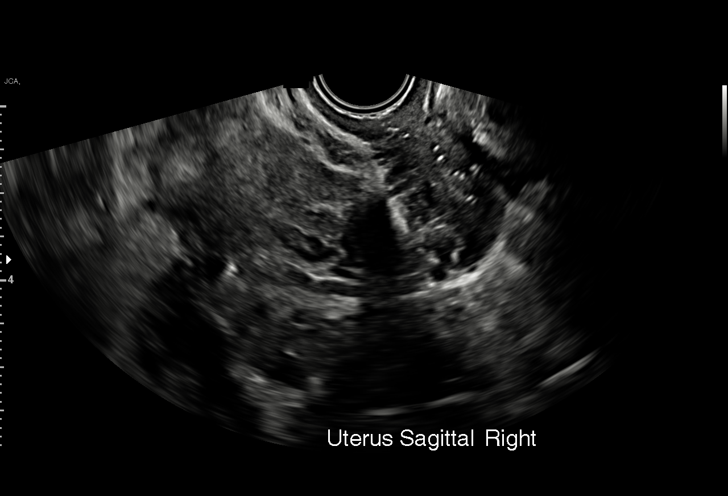
[im 26/51]
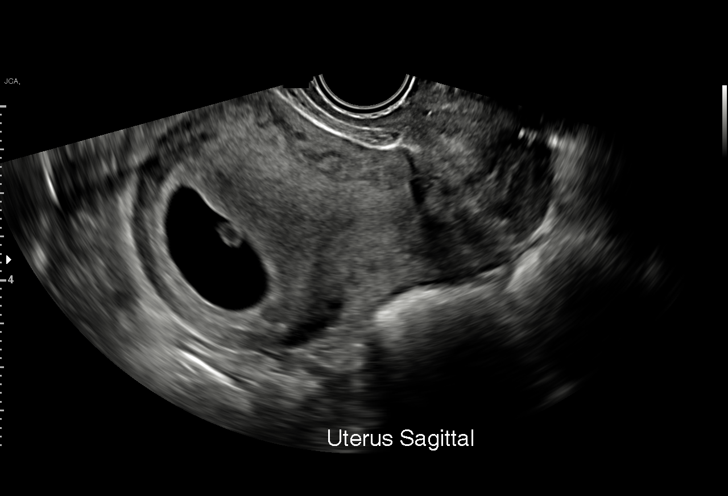
[im 28/51]
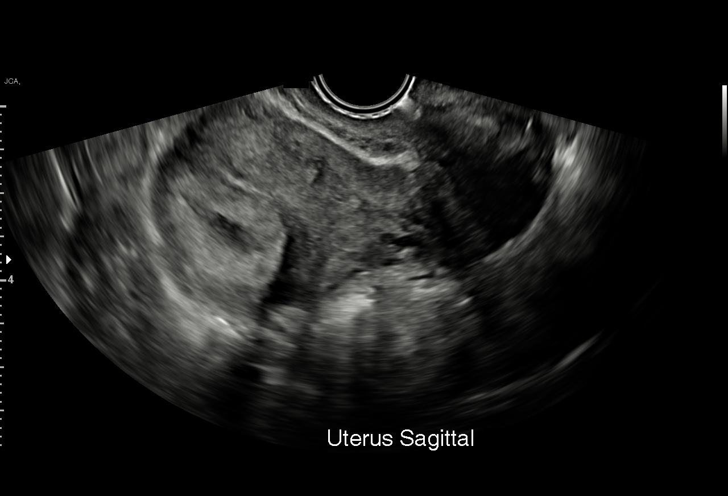
[im 32/51]
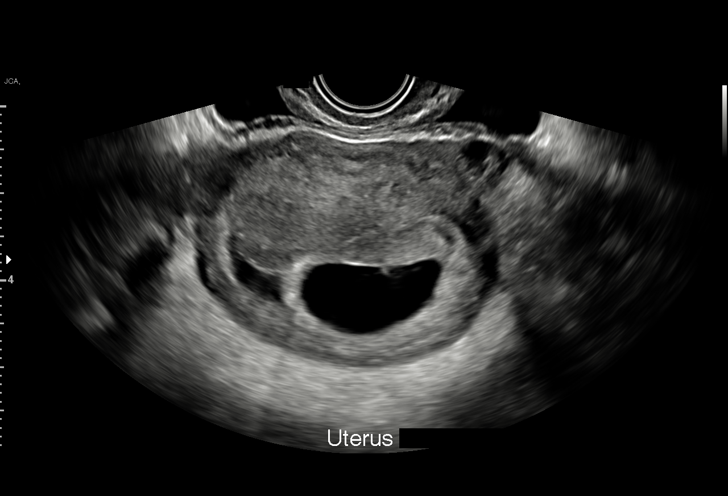
[im 36/51]
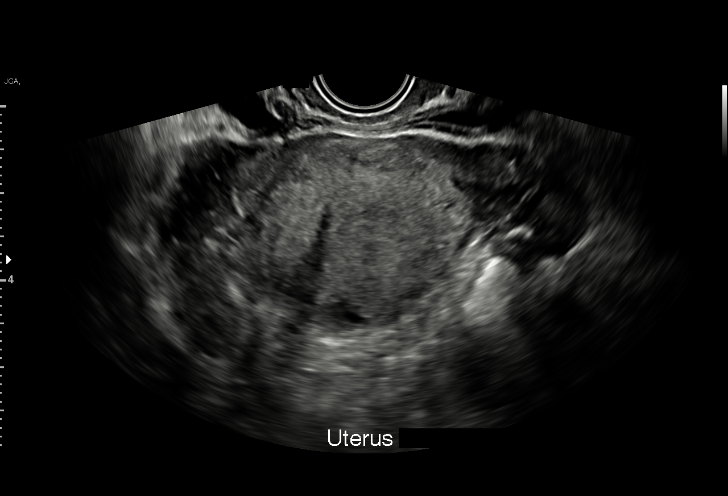
[im 39/51]
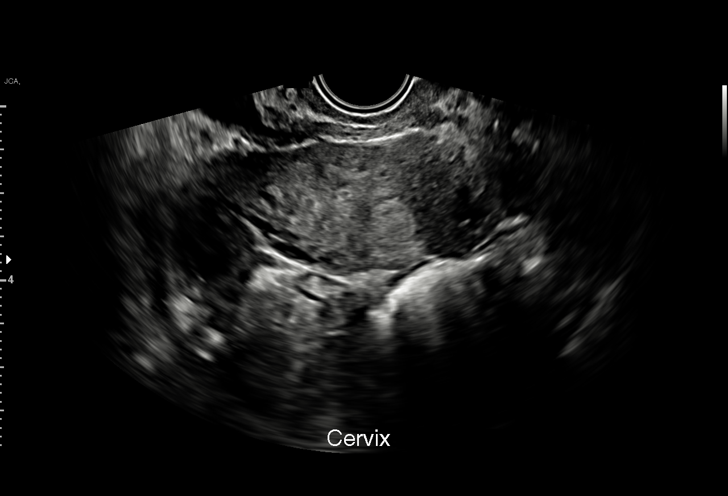
[im 43/51]
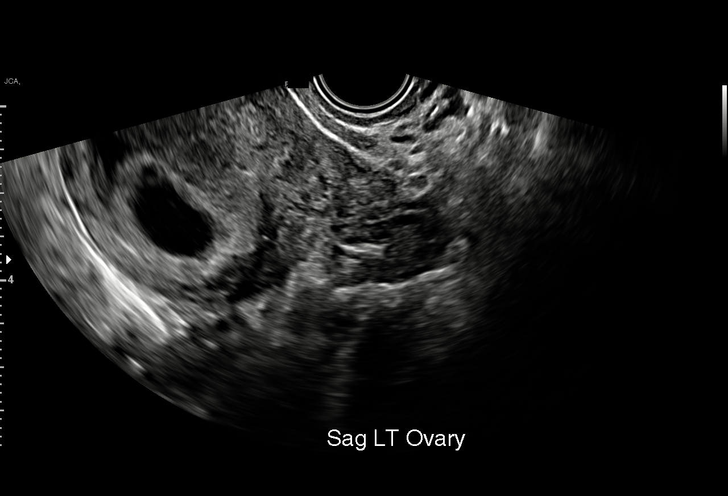
[im 47/51]
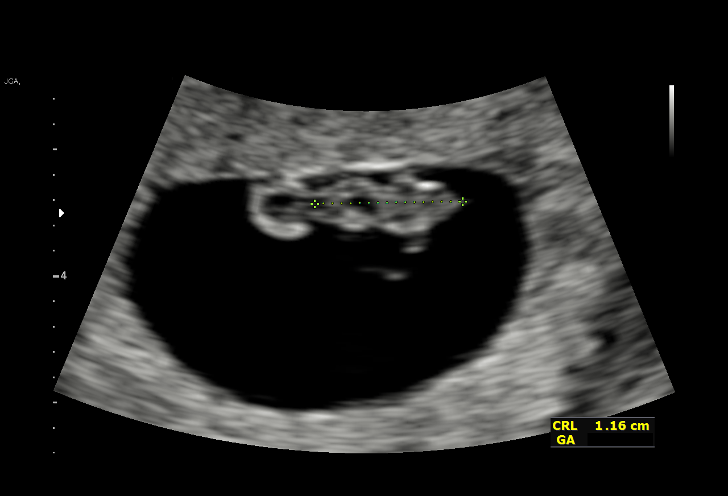
[im 51/51]
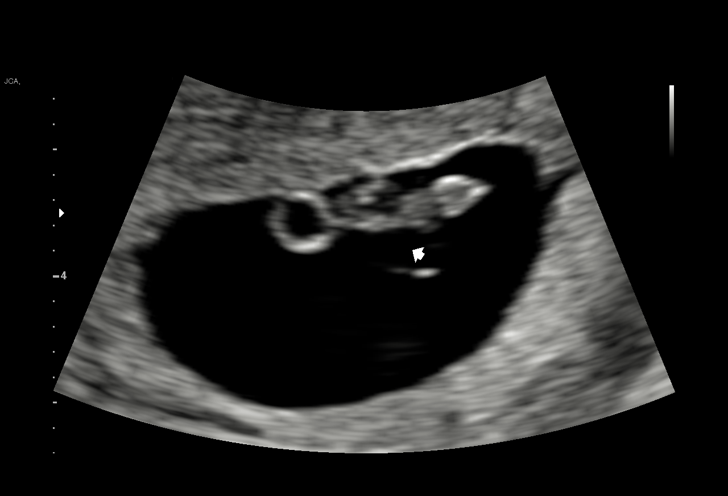

[15 of 28 positions shown; findings below may reference images not displayed]

FINDINGS: Intrauterine gestational sac: Single

Yolk sac:  Visualized.

Embryo:  Visualized.

Cardiac Activity: Visualized.

Heart Rate: 150 bpm

CRL:  12 mm   7 w   3 d                  US EDC: 07/22/2022

Subchorionic hemorrhage: 1.8 x 0.7 cm hypoechoic area to the right
of the sac may represent subchorionic hemorrhage.

Maternal uterus/adnexae: No acute process identified.
IMPRESSION: 1. Single live intrauterine pregnancy with estimated gestational age
of 7 weeks 3 days based on the current study.
2. Hypoechoic area adjacent to the sac which may represent small
subchorionic hemorrhage.

## 2024-02-15 ENCOUNTER — Other Ambulatory Visit (HOSPITAL_COMMUNITY): Payer: Self-pay

## 2024-02-15 ENCOUNTER — Telehealth: Payer: Self-pay | Admitting: Pharmacy Technician

## 2024-02-15 NOTE — Telephone Encounter (Signed)
 Pharmacy Patient Advocate Encounter  Received notification from [facility] that Prior Authorization for NURTEC 75MG  has been APPROVED from 02/15/24 to 02/14/25. Ran test claim, Copay is $0.00. This test claim was processed through [facility]- copay amounts may vary at other pharmacies due to pharmacy/plan contracts, or as the patient moves through the different stages of their insurance plan.   PA #/Case ID/Reference #: [id number]

## 2024-02-15 NOTE — Telephone Encounter (Signed)
 Pharmacy Patient Advocate Encounter   Received notification from CoverMyMeds that prior authorization for Nurtec 75MG  dispersible tablets is due for renewal.   Insurance verification completed.   The patient is insured through [facility].  Action: PA required; PA submitted to above mentioned insurance via CoverMyMeds Key/confirmation #/[id number] Status is pending
